# Patient Record
Sex: Male | Born: 1950 | Race: White | Hispanic: No | Marital: Married | Smoking: Former smoker
Health system: Southern US, Community
[De-identification: ages and names within clinical notes are randomized; demographics above are authoritative.]

## PROBLEM LIST (undated history)

## (undated) DIAGNOSIS — I10 Essential (primary) hypertension: Secondary | ICD-10-CM

## (undated) DIAGNOSIS — E119 Type 2 diabetes mellitus without complications: Secondary | ICD-10-CM

## (undated) DIAGNOSIS — K219 Gastro-esophageal reflux disease without esophagitis: Secondary | ICD-10-CM

## (undated) DIAGNOSIS — J45909 Unspecified asthma, uncomplicated: Secondary | ICD-10-CM

## (undated) DIAGNOSIS — J383 Other diseases of vocal cords: Secondary | ICD-10-CM

## (undated) DIAGNOSIS — I4891 Unspecified atrial fibrillation: Secondary | ICD-10-CM

## (undated) DIAGNOSIS — J302 Other seasonal allergic rhinitis: Secondary | ICD-10-CM

## (undated) DIAGNOSIS — G4730 Sleep apnea, unspecified: Secondary | ICD-10-CM

## (undated) DIAGNOSIS — N400 Enlarged prostate without lower urinary tract symptoms: Secondary | ICD-10-CM

## (undated) HISTORY — PX: CARDIAC CATHETERIZATION: SHX172

## (undated) HISTORY — PX: TONSILLECTOMY: SUR1361

## (undated) HISTORY — PX: ADENOIDECTOMY: SUR15

## (undated) HISTORY — PX: CARDIAC SURGERY: SHX584

---

## 2004-06-20 ENCOUNTER — Ambulatory Visit: Payer: Self-pay | Admitting: Emergency Medicine

## 2004-09-05 ENCOUNTER — Ambulatory Visit: Payer: Self-pay | Admitting: Urology

## 2004-09-12 ENCOUNTER — Ambulatory Visit: Payer: Self-pay | Admitting: Urology

## 2005-01-23 ENCOUNTER — Ambulatory Visit: Payer: Self-pay | Admitting: Cardiology

## 2005-01-29 ENCOUNTER — Ambulatory Visit: Payer: Self-pay | Admitting: Family Medicine

## 2005-08-21 ENCOUNTER — Ambulatory Visit: Payer: Self-pay | Admitting: Unknown Physician Specialty

## 2006-04-09 ENCOUNTER — Ambulatory Visit: Payer: Self-pay | Admitting: Unknown Physician Specialty

## 2006-04-19 IMAGING — CT CT ABD-PELV W/ CM
1 of 3 series · 13 of 32 positions shown, 19 images · non-contrast
Comparison: none

REASON FOR EXAM: LT kidney mass  to compare to CT abd pelv WO study on
09-05-04
COMMENTS:

[Series 2: soft tissue · axial · 0.86mm/px · z∈[-684,-264]mm · 13 of 98 slices shown, 19 images]
[im 7/98  soft-tissue]
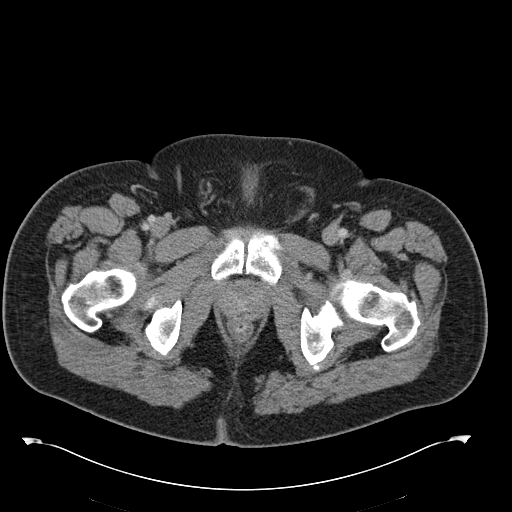
[im 7/98  bone]
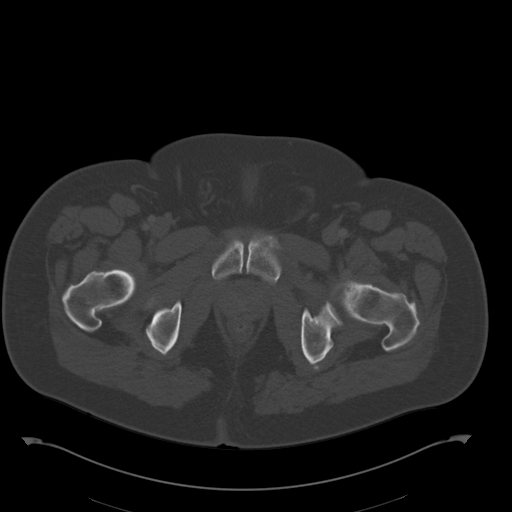
[im 14/98  soft-tissue]
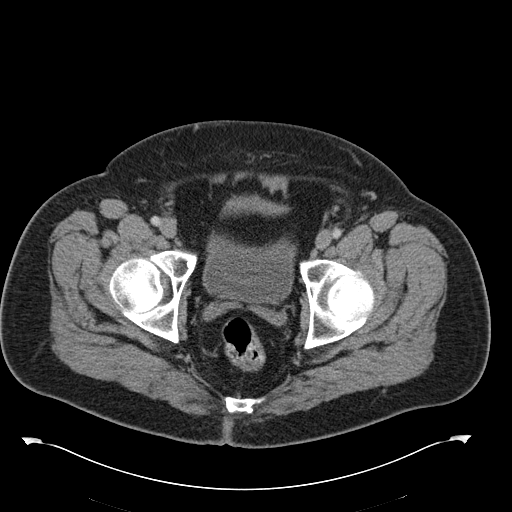
[im 21/98  soft-tissue]
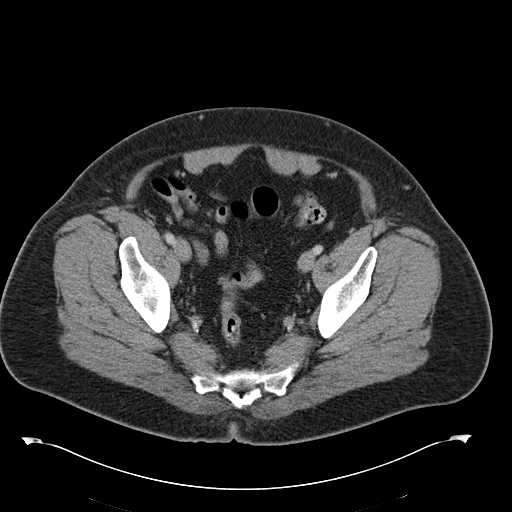
[im 28/98  soft-tissue]
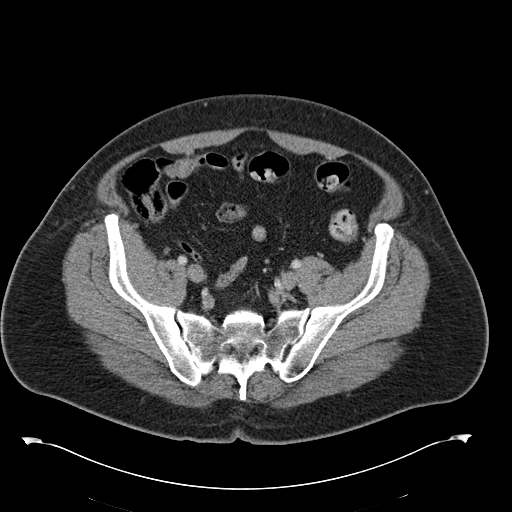
[im 35/98  soft-tissue]
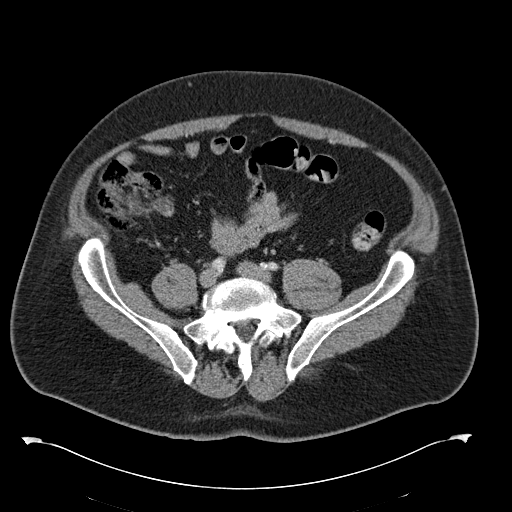
[im 42/98  soft-tissue]
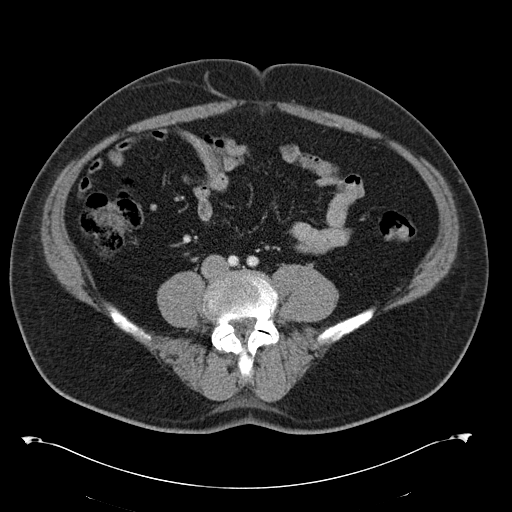
[im 49/98  soft-tissue]
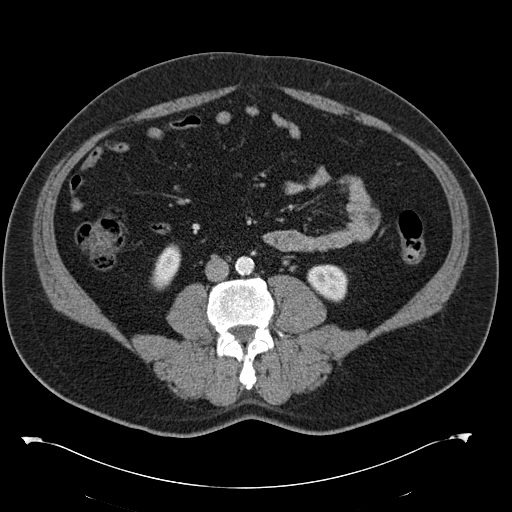
[im 56/98  soft-tissue]
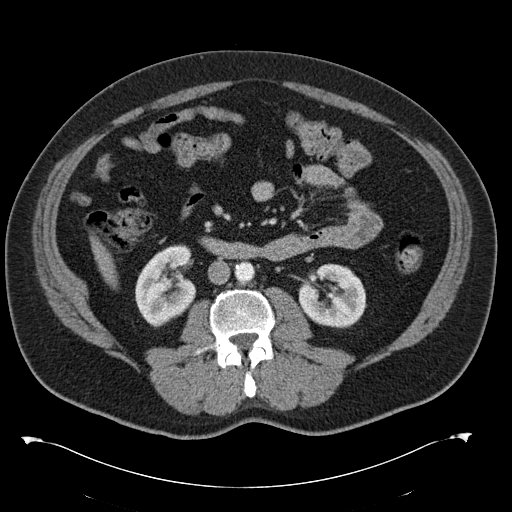
[im 63/98  soft-tissue]
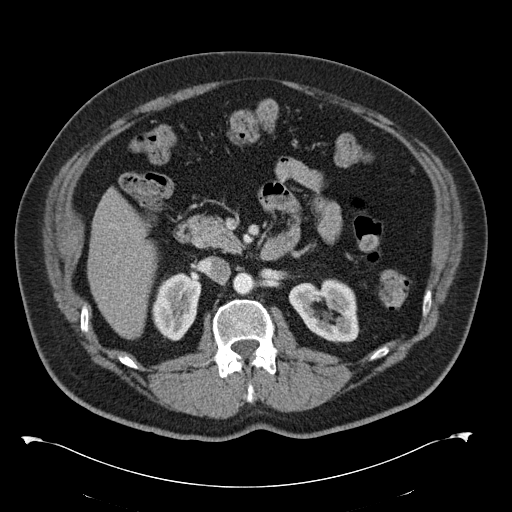
[im 63/98  bone]
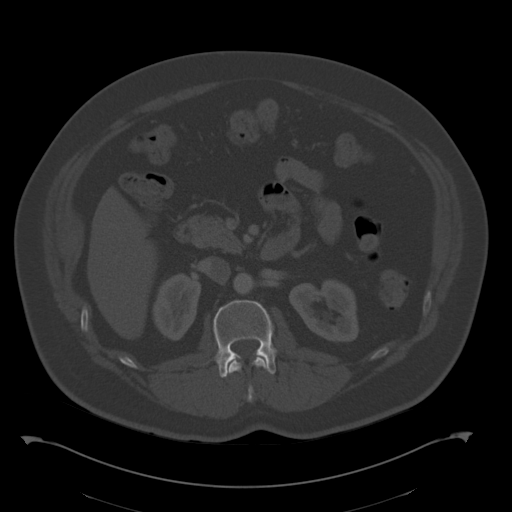
[im 70/98  soft-tissue]
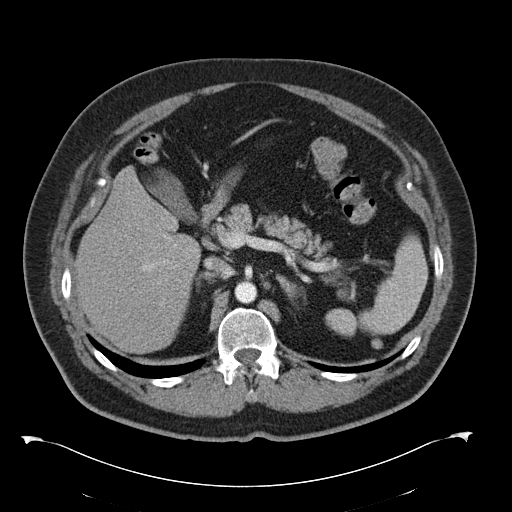
[im 70/98  lung]
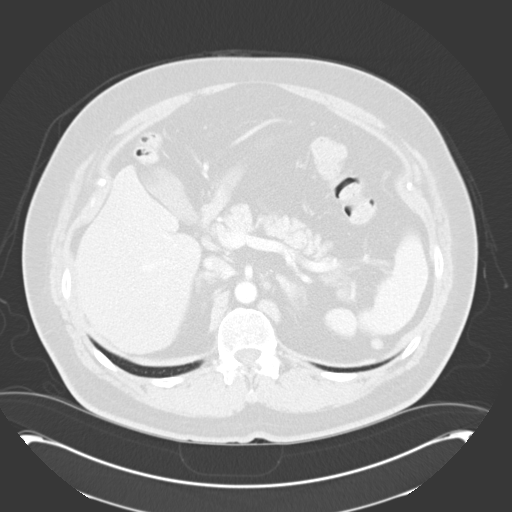
[im 77/98  soft-tissue]
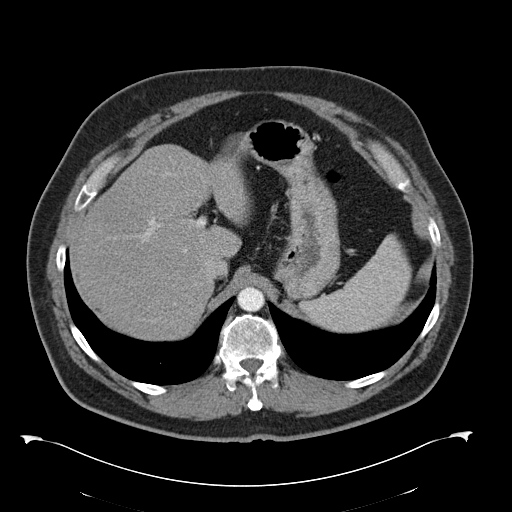
[im 77/98  lung]
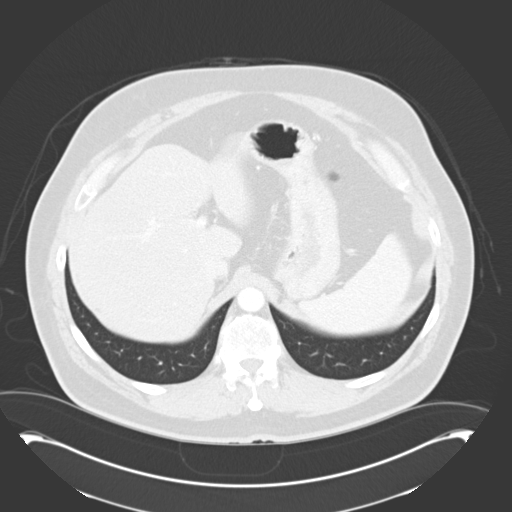
[im 84/98  soft-tissue]
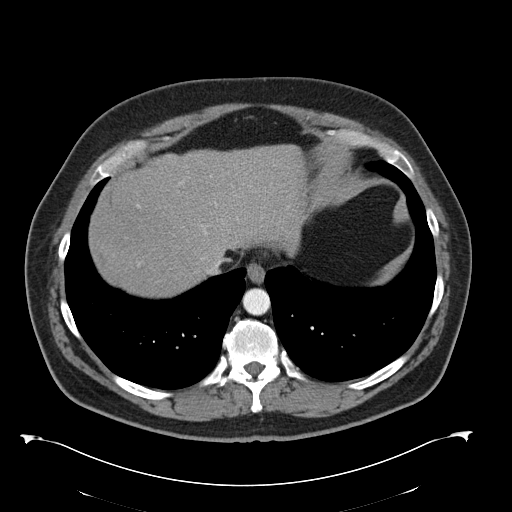
[im 84/98  lung]
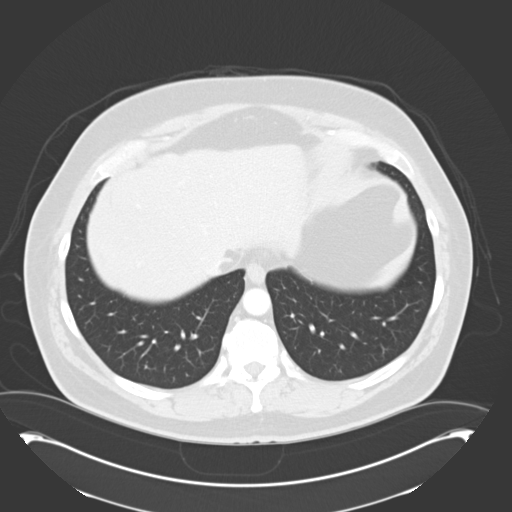
[im 91/98  soft-tissue]
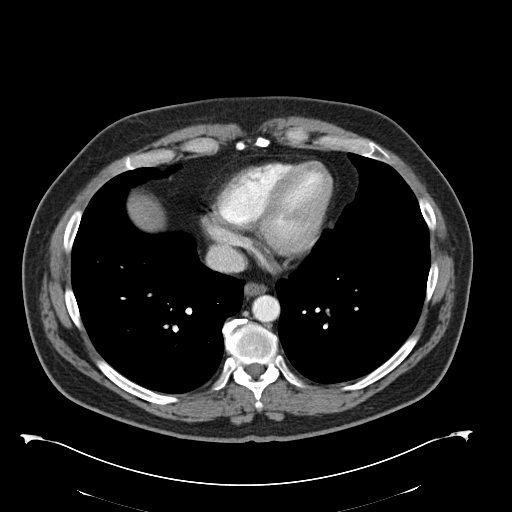
[im 91/98  lung]
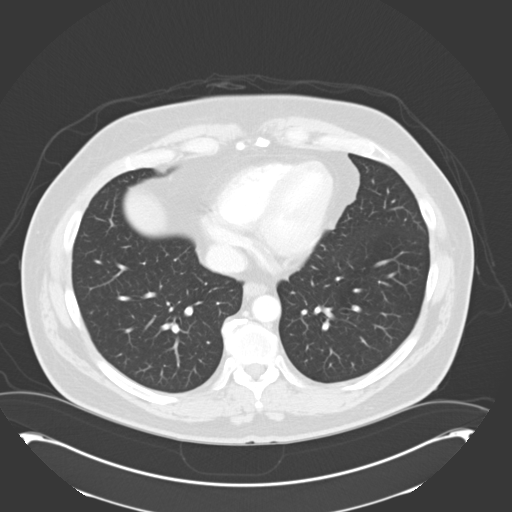

[13 of 32 positions shown; findings below may reference images not displayed]

PROCEDURE:     CT  - CT ABDOMEN / PELVIS  W  - September 12, 2004  [DATE]

RESULT:     IV contrast enhanced CT scan of the abdomen and pelvis is
compared to the prior non-contrast study performed on 09-05-04.  Today's study
shows a fairly well circumscribed area of non-enhancing low-density in the
mid to lower portion of the LEFT kidney anteriorly.  This measures
approximately 2.4 cm in maximal length extending from the parapelvic region
to the cortical surface and suggests a renal cyst, but follow-up to document
stability would be recommended.  There is no hydronephrosis.  The remainder
of the kidney enhances normally. A definite solid lesion is not seen.
Hounsfield readings on this area in the LEFT kidney anteriorly are
approximately 2.0 on the immediate post-contrast images and approximately 4
on the delayed images. The lung bases are clear. No filling defect is seen
in the partially opacified urinary bladder on the delayed images. There is
no evidence of pelvic or retroperitoneal mass or adenopathy.  No radiopaque
gallstones are evident. The upper abdominal viscera appear to be grossly
normal.
IMPRESSION: 1)Low-density area in the LEFT kidney suggestive of a simple LEFT renal
cyst.  No definite associated calcification is seen on this study, but no
non-contrast images were obtained at this time.

## 2006-07-03 ENCOUNTER — Ambulatory Visit: Payer: Self-pay | Admitting: Cardiology

## 2006-09-05 IMAGING — RF DG UGI W/O KUB
1 series · 15 of 18 positions shown · non-contrast
Comparison: none

REASON FOR EXAM: LUQ abdominal pain
COMMENTS:

[Series 1: run · 15 of 18 slices shown]
[im 1/18]
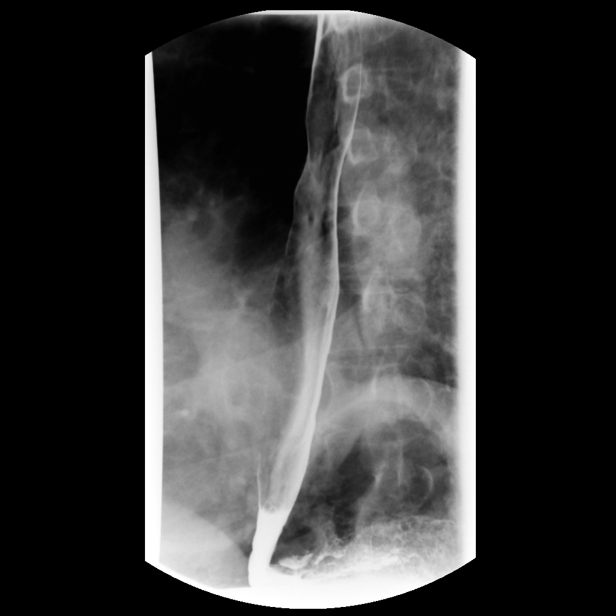
[im 2/18]
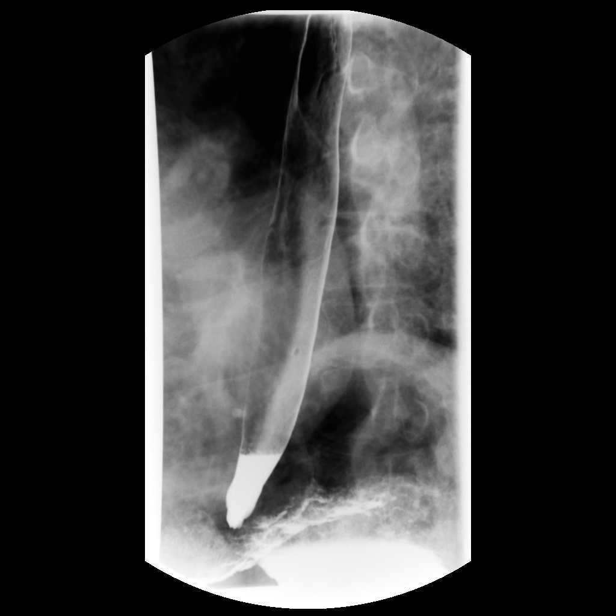
[im 4/18]
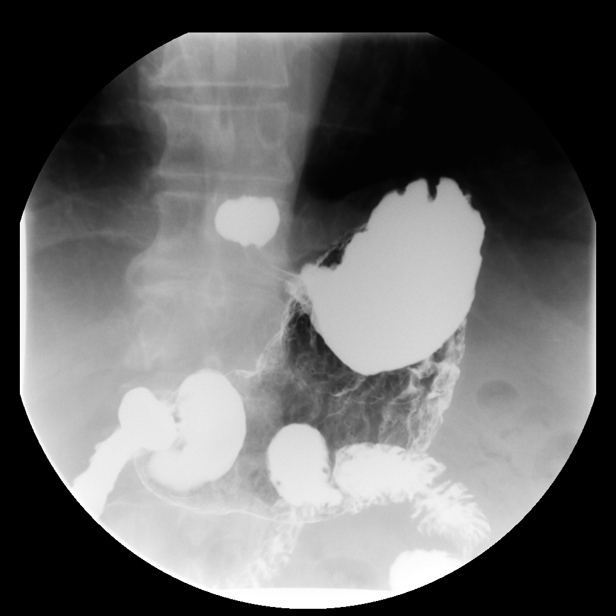
[im 5/18]
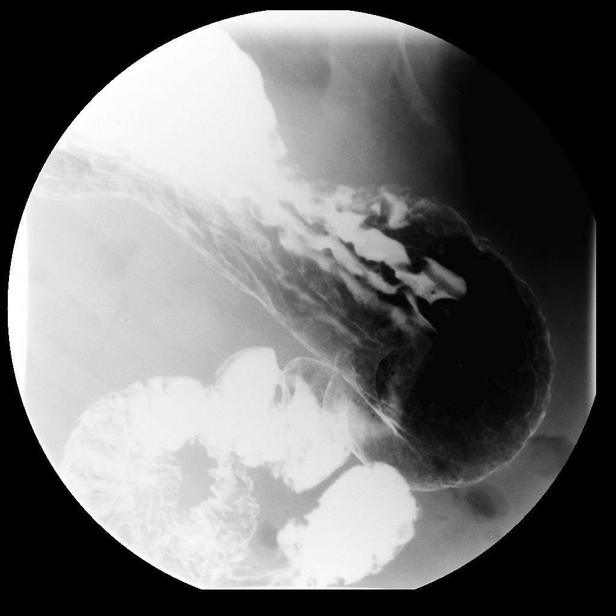
[im 6/18]
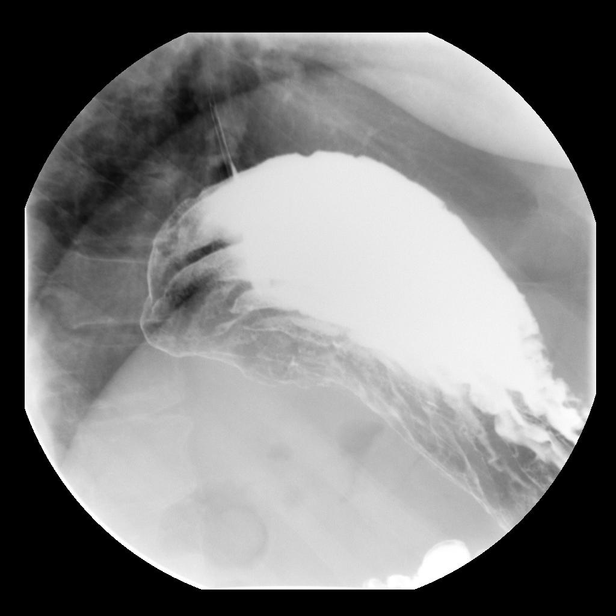
[im 7/18]
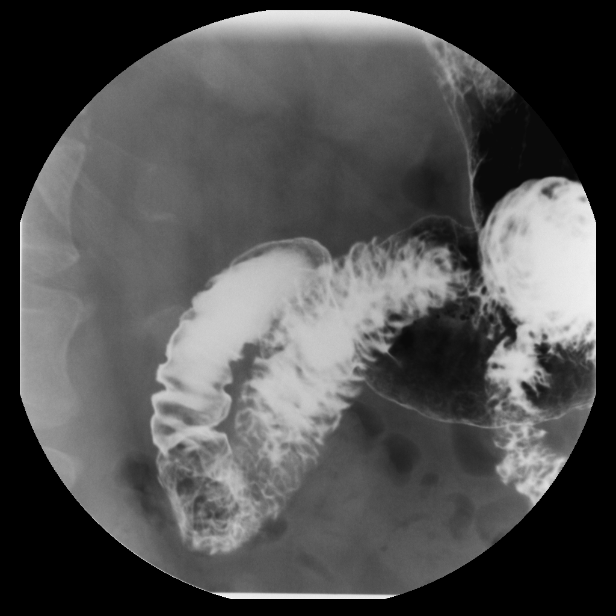
[im 8/18]
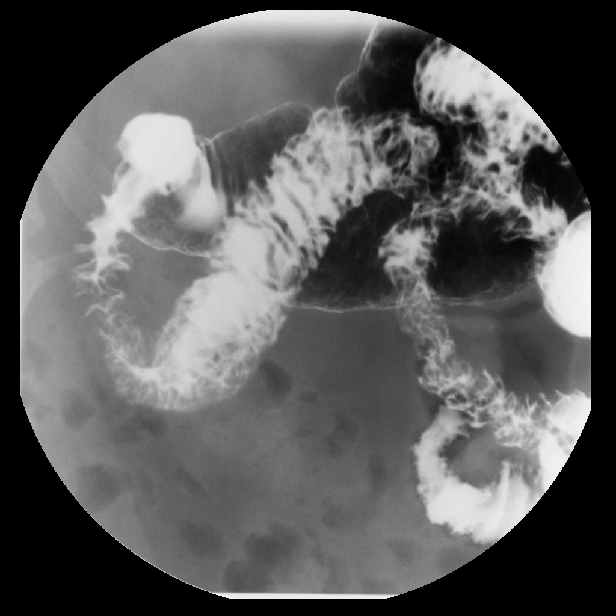
[im 10/18]
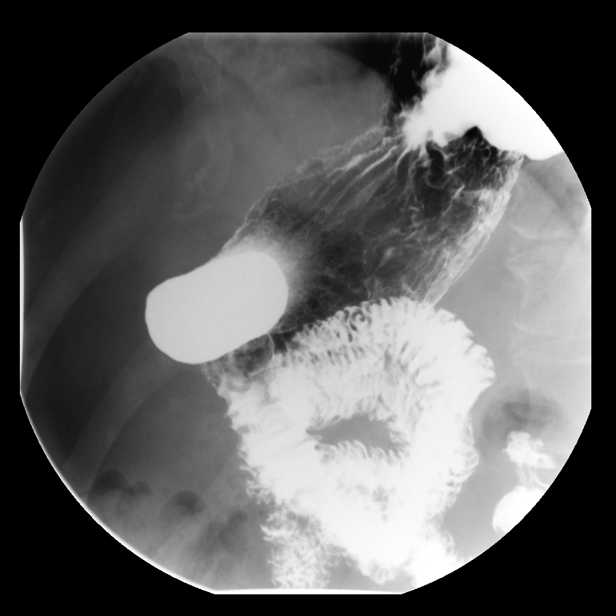
[im 11/18]
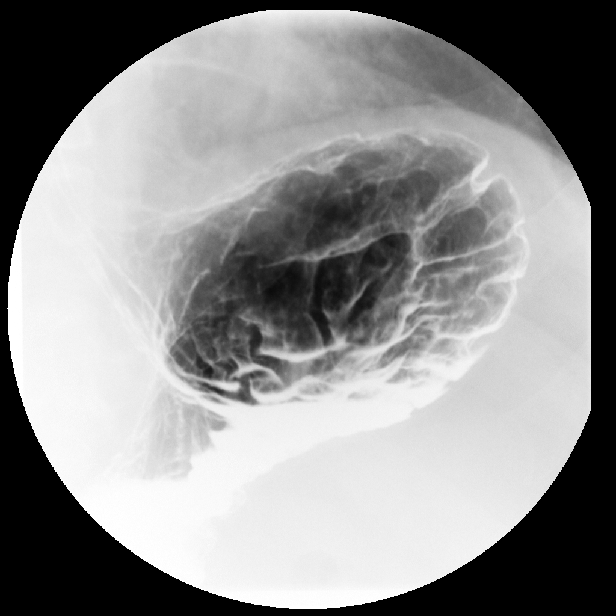
[im 12/18]
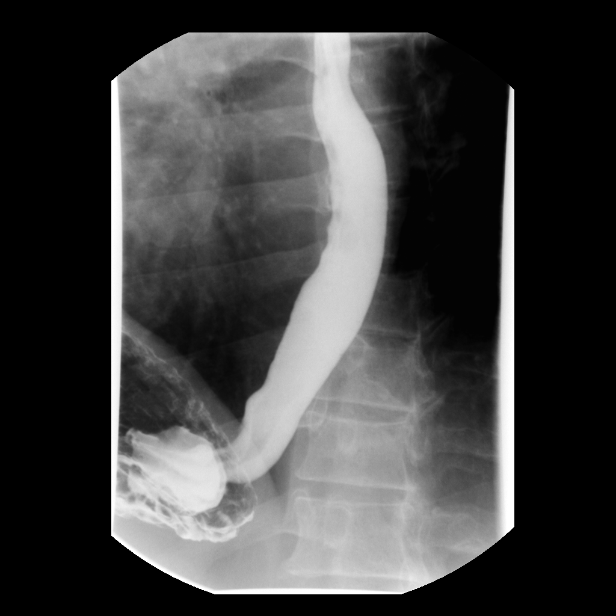
[im 13/18]
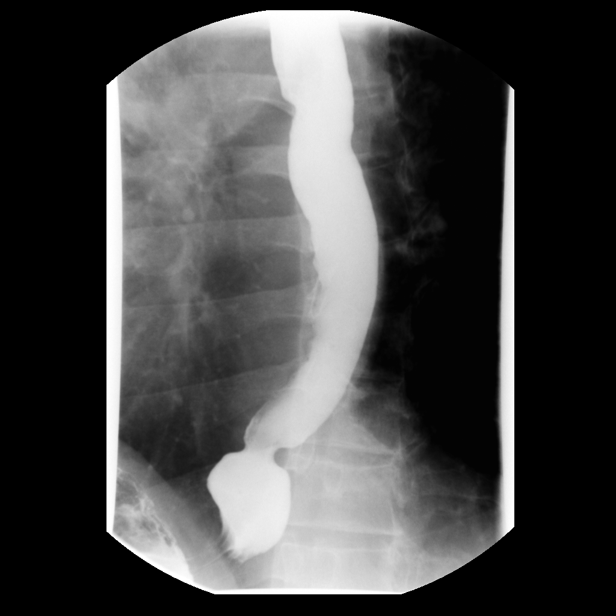
[im 14/18]
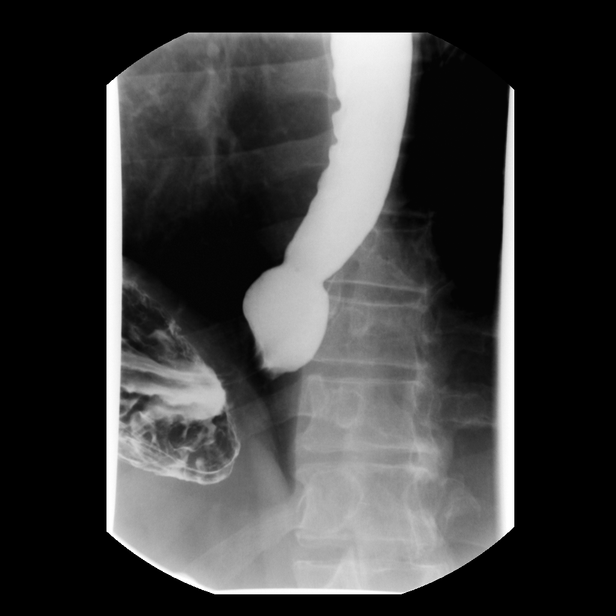
[im 16/18]
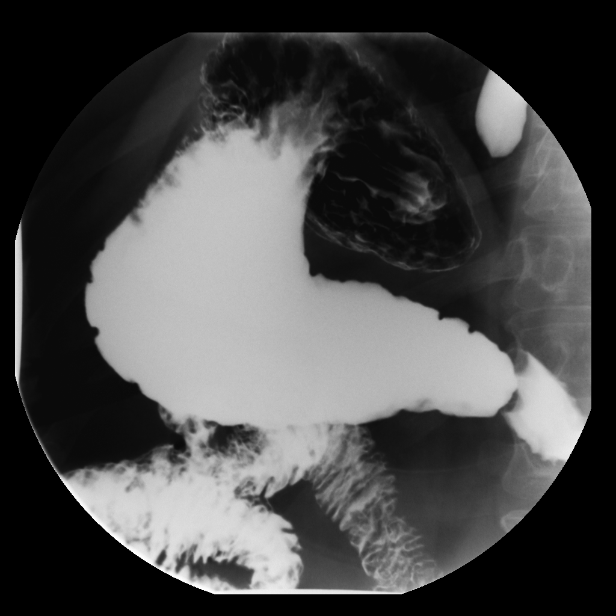
[im 17/18]
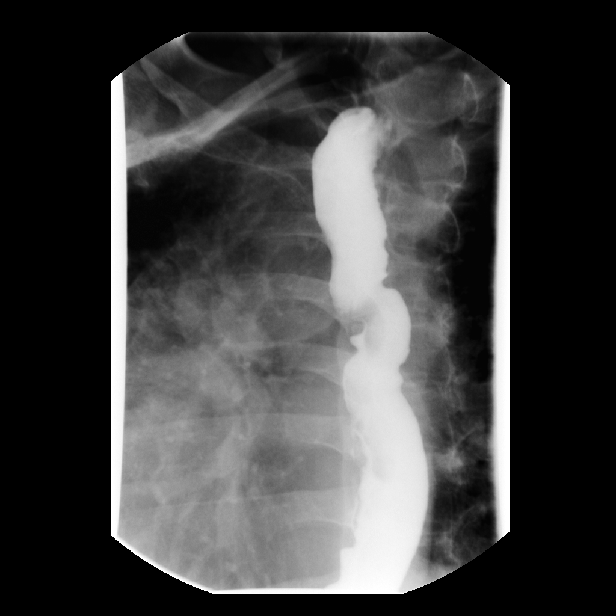
[im 18/18]
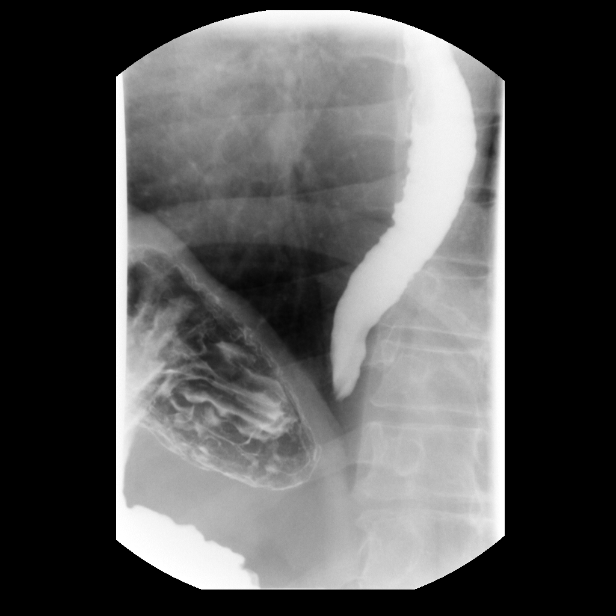

[15 of 18 positions shown; findings below may reference images not displayed]

PROCEDURE:     FL  - FL UPPER GI  - January 29, 2005  [DATE]

RESULT:     The patient is complaining of LEFT upper quadrant discomfort and
has occasional reflux symptoms and heartburn. The patient has known
gallstones. The patient has undergone prior esophageal dilation.

The cervical esophagus appeared normal in a survey fashion. There was no
laryngeal penetration of the barium. The thoracic esophagus distended well.
The 12.0 mm barium pill passed without difficulty. A small, reducible hiatal
hernia was demonstrated. No significant gastroesophageal reflux was
elicited.

The stomach distended well. The duodenal bulb and duodenal C-sweep were
normal in appearance.
IMPRESSION: 1.     There are mild changes of presbyesophagus but I do not see evidence
of fixed stricture or evidence of esophagitis. There is a small, reducible
hiatal hernia.
2.     The stomach and duodenum are normal in appearance.

## 2007-05-28 ENCOUNTER — Ambulatory Visit: Payer: Self-pay | Admitting: Specialist

## 2007-12-03 ENCOUNTER — Ambulatory Visit: Payer: Self-pay | Admitting: Specialist

## 2008-02-07 IMAGING — US US CAROTID DUPLEX BILAT
1 series · 17 of 24 positions shown · non-contrast
Comparison: none

REASON FOR EXAM: Positive Bruit
COMMENTS:

[Series 1: us carotid duplex bilat · 17 of 64 slices shown]
[im 1/64]
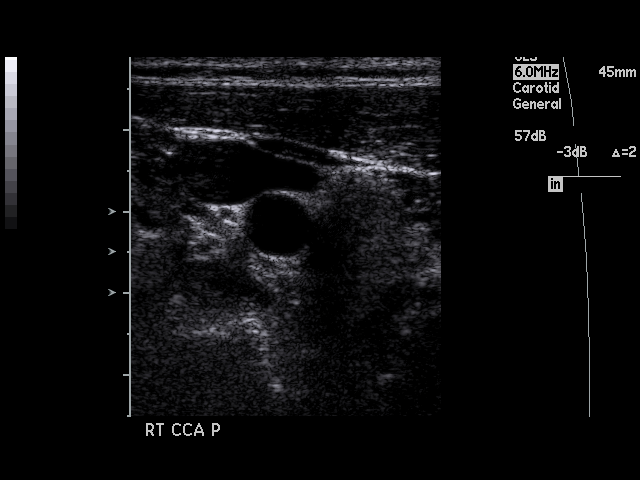
[im 6/64]
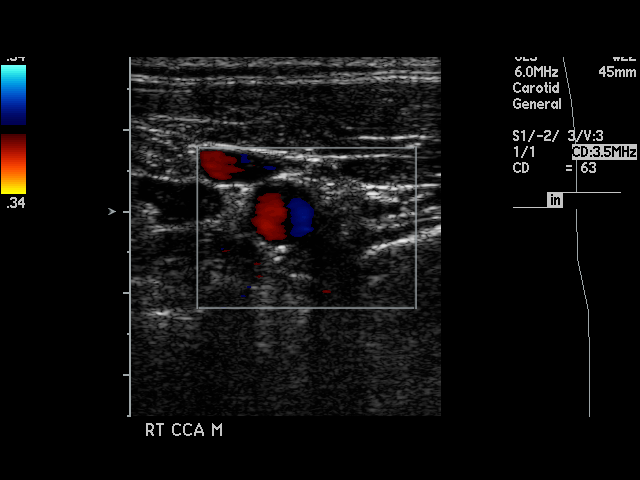
[im 9/64]
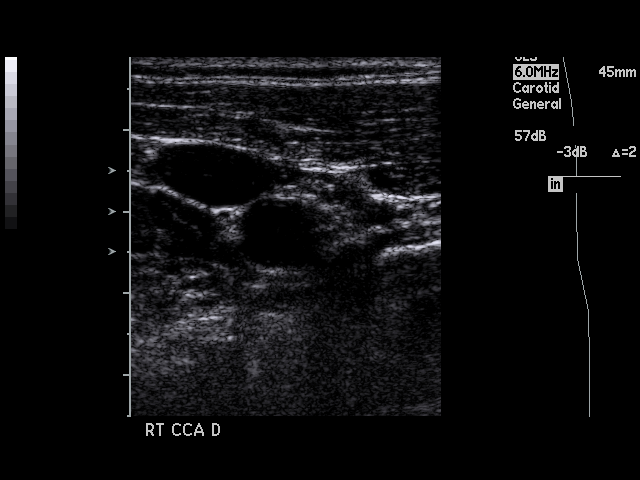
[im 11/64]
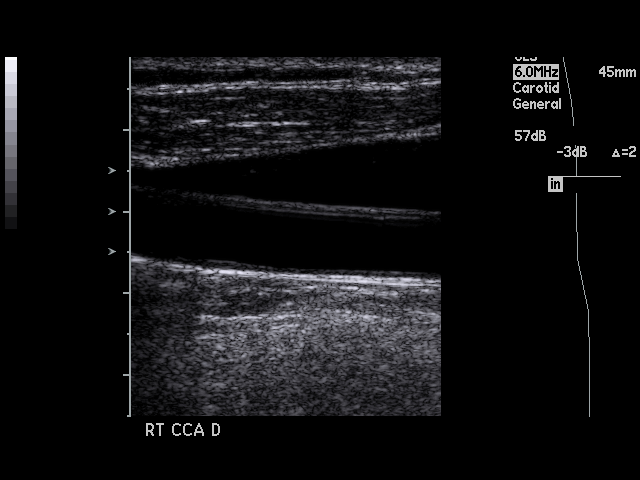
[im 17/64]
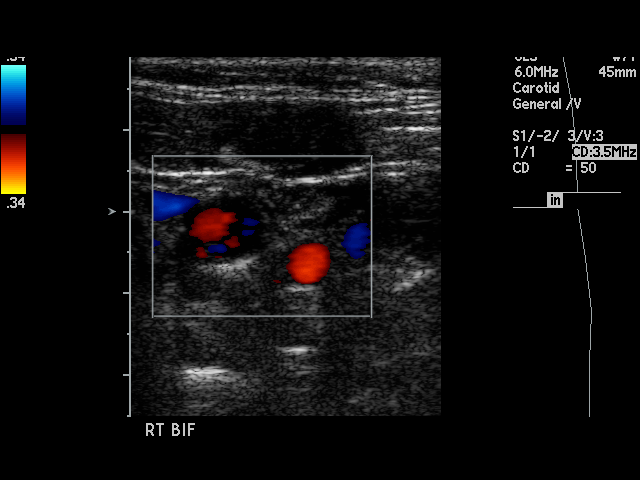
[im 20/64]
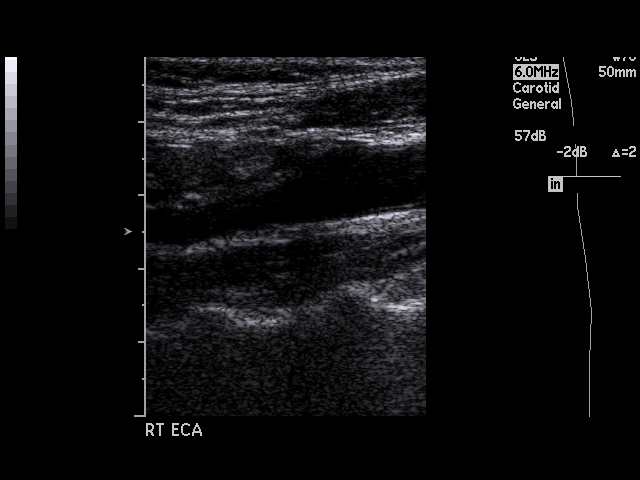
[im 25/64]
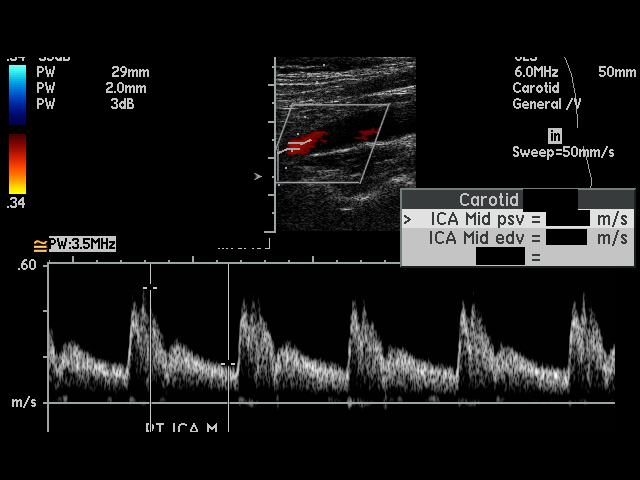
[im 28/64]
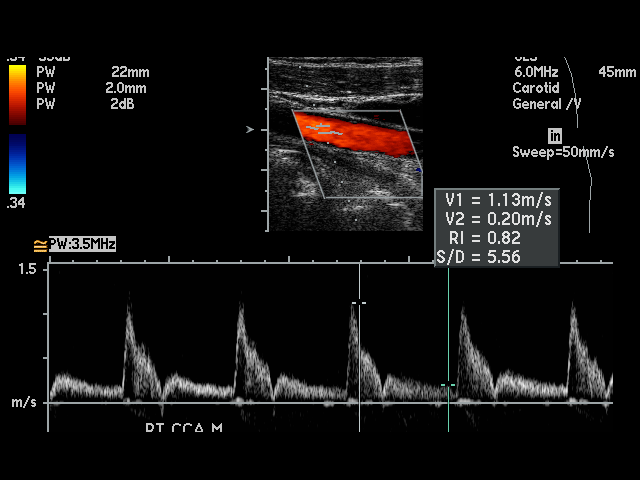
[im 33/64]
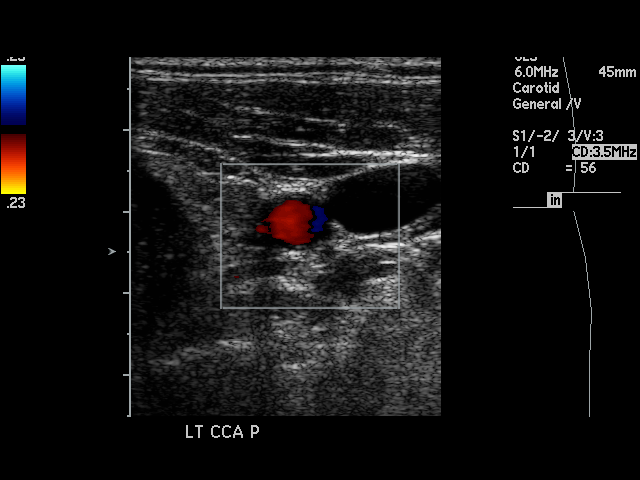
[im 36/64]
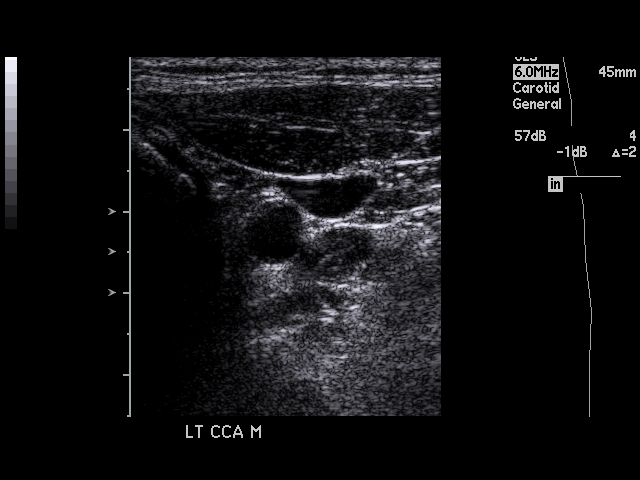
[im 39/64]
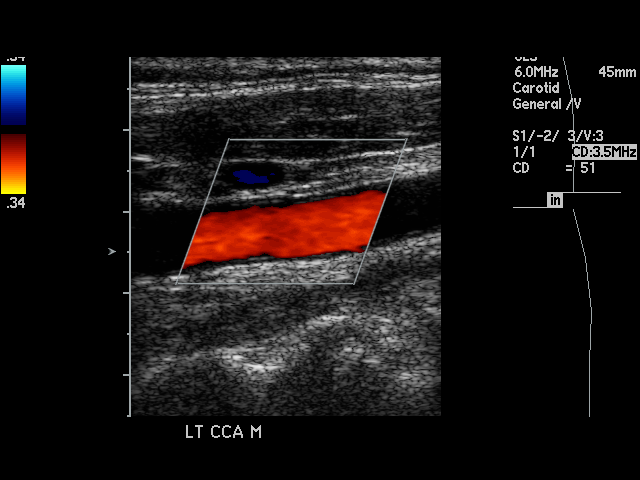
[im 44/64]
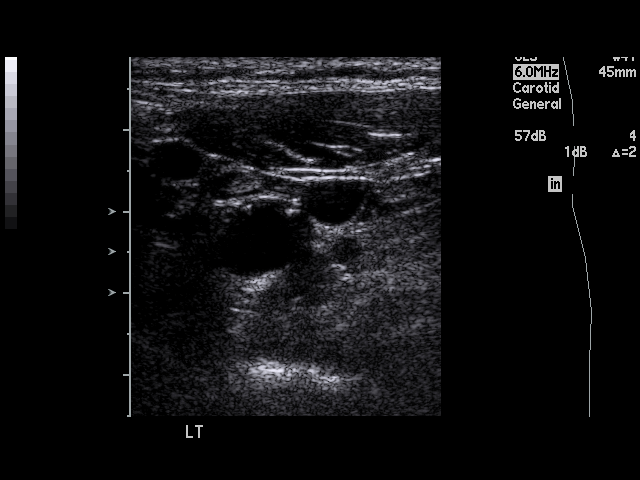
[im 47/64]
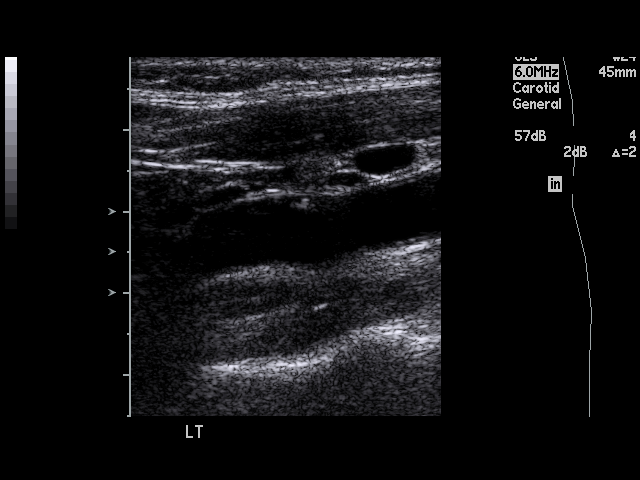
[im 53/64]
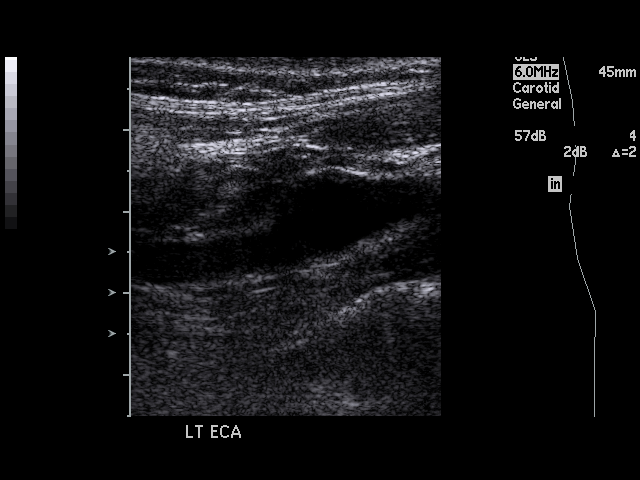
[im 55/64]
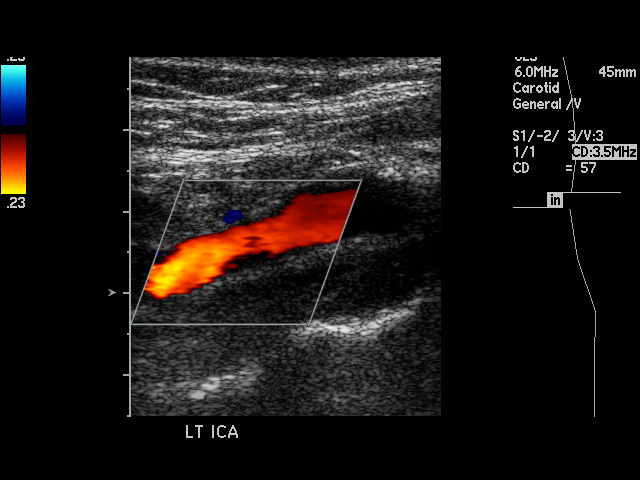
[im 58/64]
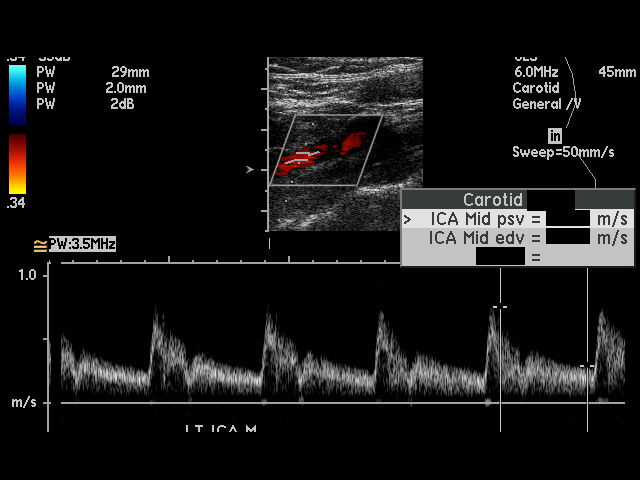
[im 64/64]
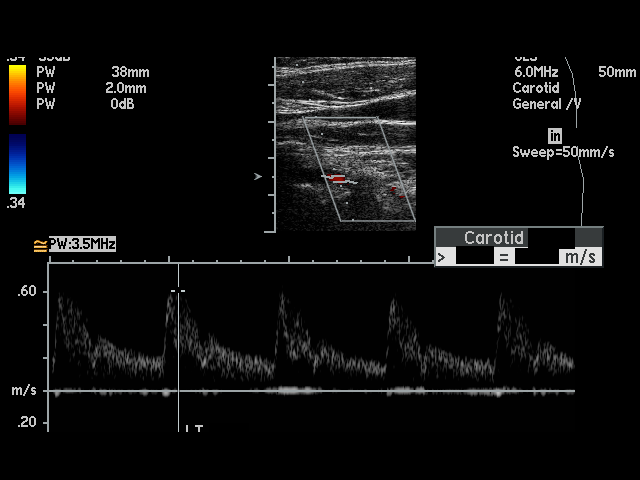

[17 of 24 positions shown; findings below may reference images not displayed]

PROCEDURE:     US  - US CAROTID DOPPLER BILATERAL  - July 03, 2006 [DATE]

RESULT:     Gray scale, Duplex, color and SPECTRAL waveform imaging was
performed of the RIGHT and LEFT carotid systems.

Visual evaluation of the RIGHT carotid system demonstrates asymmetric
calcified plaque within the internal carotid arteries and smooth plaque
within the carotid bulb and intimal thickening in the common carotid artery.
 These areas demonstrate less than 50% visual stenosis.

Evaluation of the LEFT carotid system demonstrates smooth plaque within the
internal carotid artery, calcified plaque in the carotid bulb and intimal
thickening within the common carotid artery without evidence of greater than
50% visual stenosis.

ICA/CCA ratios:  RIGHT:  0.510   LEFT

Gray scale, color flow and SPECTRAL waveform imaging in the RIGHT and LEFT
carotid system demonstrates no abnormalities.

Antegrade flow is demonstrated within the RIGHT and LEFT vertebral arteries.
IMPRESSION: No evidence of hemodynamically significant stenosis within the RIGHT or LEFT
carotid systems as described above.

## 2010-06-01 DIAGNOSIS — L570 Actinic keratosis: Secondary | ICD-10-CM | POA: Insufficient documentation

## 2010-07-06 ENCOUNTER — Ambulatory Visit: Payer: Self-pay | Admitting: General Practice

## 2010-07-13 ENCOUNTER — Ambulatory Visit: Payer: Self-pay | Admitting: General Practice

## 2011-03-12 ENCOUNTER — Ambulatory Visit: Payer: Self-pay | Admitting: Internal Medicine

## 2011-04-17 ENCOUNTER — Ambulatory Visit: Payer: Self-pay | Admitting: Internal Medicine

## 2011-06-11 DIAGNOSIS — R300 Dysuria: Secondary | ICD-10-CM | POA: Insufficient documentation

## 2011-07-01 ENCOUNTER — Ambulatory Visit: Payer: Self-pay | Admitting: Unknown Physician Specialty

## 2011-07-23 DIAGNOSIS — E785 Hyperlipidemia, unspecified: Secondary | ICD-10-CM | POA: Insufficient documentation

## 2011-07-23 DIAGNOSIS — J0100 Acute maxillary sinusitis, unspecified: Secondary | ICD-10-CM | POA: Insufficient documentation

## 2011-07-23 DIAGNOSIS — R05 Cough: Secondary | ICD-10-CM | POA: Insufficient documentation

## 2011-10-03 DIAGNOSIS — E119 Type 2 diabetes mellitus without complications: Secondary | ICD-10-CM | POA: Insufficient documentation

## 2012-02-21 DIAGNOSIS — G4733 Obstructive sleep apnea (adult) (pediatric): Secondary | ICD-10-CM | POA: Insufficient documentation

## 2012-05-07 ENCOUNTER — Ambulatory Visit: Payer: Self-pay | Admitting: Internal Medicine

## 2012-09-14 DIAGNOSIS — J385 Laryngeal spasm: Secondary | ICD-10-CM | POA: Insufficient documentation

## 2012-10-02 ENCOUNTER — Ambulatory Visit: Payer: Self-pay

## 2012-10-27 DIAGNOSIS — Z87898 Personal history of other specified conditions: Secondary | ICD-10-CM | POA: Insufficient documentation

## 2013-09-15 DIAGNOSIS — R7309 Other abnormal glucose: Secondary | ICD-10-CM | POA: Insufficient documentation

## 2013-09-15 DIAGNOSIS — K769 Liver disease, unspecified: Secondary | ICD-10-CM | POA: Insufficient documentation

## 2013-09-15 DIAGNOSIS — I10 Essential (primary) hypertension: Secondary | ICD-10-CM | POA: Insufficient documentation

## 2013-09-15 DIAGNOSIS — E782 Mixed hyperlipidemia: Secondary | ICD-10-CM | POA: Insufficient documentation

## 2014-02-16 DIAGNOSIS — R7303 Prediabetes: Secondary | ICD-10-CM | POA: Insufficient documentation

## 2014-02-16 DIAGNOSIS — I10 Essential (primary) hypertension: Secondary | ICD-10-CM | POA: Insufficient documentation

## 2015-01-10 ENCOUNTER — Telehealth: Payer: Self-pay | Admitting: Family Medicine

## 2015-01-10 ENCOUNTER — Ambulatory Visit: Payer: Self-pay | Admitting: Physician Assistant

## 2015-01-10 DIAGNOSIS — Z299 Encounter for prophylactic measures, unspecified: Secondary | ICD-10-CM

## 2015-01-10 NOTE — Progress Notes (Signed)
Patient ID: Ronnie Cunningham, male   DOB: [birth year], 64 y.o.   MRN: [id number] Patient came in to have a TDap done.  He expressed that he accidentally stuck a wire in his elbow.  He wasn't sure when his last Tetanus was so he just wanted to get the Tdap vaccine.  Pt didn't want to see the Physician Assistant today.

## 2015-01-10 NOTE — Telephone Encounter (Signed)
Notified pt that his last documented tetanus shot was 06/05/2006.

## 2015-01-10 NOTE — Telephone Encounter (Signed)
Pt would like to know when he had his last Tdap shot b/c he was stuck my a piece of metal wire and would like a call asap so he will know if he needs to be treated. It looks like pt was last seen in our office on 02/13/11. Please advised. Thanks TNP

## 2015-02-22 DIAGNOSIS — R7303 Prediabetes: Secondary | ICD-10-CM | POA: Insufficient documentation

## 2015-06-09 ENCOUNTER — Encounter: Payer: Self-pay | Admitting: Sports Medicine

## 2015-06-09 ENCOUNTER — Ambulatory Visit (INDEPENDENT_AMBULATORY_CARE_PROVIDER_SITE_OTHER): Payer: Medicare HMO | Admitting: Sports Medicine

## 2015-06-09 DIAGNOSIS — L600 Ingrowing nail: Secondary | ICD-10-CM

## 2015-06-09 DIAGNOSIS — M79675 Pain in left toe(s): Secondary | ICD-10-CM | POA: Diagnosis not present

## 2015-06-09 DIAGNOSIS — E119 Type 2 diabetes mellitus without complications: Secondary | ICD-10-CM

## 2015-06-09 DIAGNOSIS — B351 Tinea unguium: Secondary | ICD-10-CM | POA: Diagnosis not present

## 2015-06-09 DIAGNOSIS — M79674 Pain in right toe(s): Secondary | ICD-10-CM | POA: Diagnosis not present

## 2015-06-09 MED ORDER — CEPHALEXIN 500 MG PO CAPS: 500.0000 mg | ORAL_CAPSULE | Freq: Three times a day (TID) | ORAL | Status: DC

## 2015-06-09 NOTE — Progress Notes (Signed)
Patient ID: Ronnie Cunningham, male   DOB: [birth year], 65 y.o.   MRN: [id number] Subjective: Ronnie Cunningham is a 65 y.o. Diabetic male patient presents to office today complaining of a painful incurvated, right lateral and left medial nail border with deformity of the hallux nails and thickening as the nails grow out which causes pain. Patient has treated this by self trimming and states that many years ago had nail procedures. Patient denies fever/chills/nausea/vomitting/any other related constitutional symptoms at this time. Patient desires to have his other nails trimmed as well.   FBS 132mg /dl this am  Patient Active Problem List   Diagnosis Date Noted  . Morbid (severe) obesity due to excess calories ([place]) 04/12/2015  . Borderline diabetes mellitus 02/22/2015  . BP (high blood pressure) 02/16/2014  . 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 Abnormal blood sugar 09/15/2013  . Chronic nonalcoholic liver disease 0000000  . Essential (primary) hypertension 09/15/2013  . 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 H/O neoplasm 10/27/2012  . 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 Obstructive apnea 02/21/2012  . Controlled type 2 diabetes mellitus without complication ([place]) [id number]  . Type 2 diabetes mellitus ([place]) 10/03/2011  . Acute antritis 07/23/2011  . Cough 07/23/2011  . HLD (hyperlipidemia) 07/23/2011  . Difficult or painful urination 06/11/2011  . Actinic keratoses 06/01/2010    No current outpatient prescriptions on file prior to visit.   No current facility-administered medications on file prior to visit.    Allergies  Allergen Reactions  . Other Other (See Comments)    Bee sting  . Pollen Extract Other (See Comments)  . Tape Other (See Comments)    Objective:  There were no vitals filed for this visit.  General: Well developed, nourished, in no acute distress, alert and oriented x3   Dermatology: Skin is warm, dry and supple bilateral. Bilateral  hallux nail appears to be  moderately incurvated with hyperkeratosis formation at the distal aspects of  the medial nail border on the left and lateral nail border on the right with significant deformity and thickness of the nails. (-) Erythema. (+) Edema. (-) serosanguous drainage present. The remaining nails appear mildly elongated, discolored, and thickened with no signs of ingrowing. There are no open sores, lesions or other signs of infection present.  Vascular: Dorsalis Pedis artery and Posterior Tibial artery pedal pulses are 2/4 bilateral with immedate capillary fill time. Mild Pedal hair growth present.  Neruologic: Grossly intact via light touch bilateral. Protective and vibratory intact bilateral.   Musculoskeletal: Tenderness to palpation of the right an left hallux nail fold(s). Muscular strength within normal limits in all groups bilateral.   Assesement and Plan: Problem List Items Addressed This Visit    None    Visit Diagnoses    Ingrown nail    -  Primary    Relevant Medications    cephALEXin (KEFLEX) 500 MG capsule    Dermatophytosis of nail        Relevant Medications    sulfamethoxazole-trimethoprim (BACTRIM DS,SEPTRA DS) 800-160 MG tablet    cephALEXin (KEFLEX) 500 MG capsule    Toe pain, bilateral        Diabetes mellitus without complication (HCC)        Relevant Medications    aspirin 325 MG tablet    atorvastatin (LIPITOR) 20 MG tablet    bisoprolol-hydrochlorothiazide (ZIAC) 2.5-6.25 MG tablet    metFORMIN (GLUCOPHAGE) 500 MG tablet       -Discussed treatment alternatives and plan  of care; Explained permanent/temporary nail avulsion and post procedure course to patient. Patient opt for permanent avulsion of both the left and right hallux nails - After a verbal consent, injected 3 ml of a 50:50 mixture of 2% plain  lidocaine and 0.5% plain marcaine in a normal hallux block fashion to the left and right hallux. Next, a  betadine prep was performed.  Anesthesia was tested and found to be appropriate.  The left and right hallux nails were then incised from the hyponychium to the epinychium. The nails were removed and cleared from the field. The areas were curretted for any remaining nail or spicules. Phenol application performed and the areas were then flushed with alcohol and dressed with antibiotic cream and a dry sterile dressing. -Patient was instructed to leave the dressings intact for today and begin soaking  in a weak solution of betadine and water tomorrow. Patient was instructed to  soak for 15 minutes each day and apply neosporin and a gauze or bandaid dressing each day. -Patient was instructed to monitor the tose for signs of infection and return to office if toe becomes red, hot or swollen. -Rx Keflex 500mg  for prophylaxsis against infection in the setting of bilateral nail procedures and diabetes -Mechanically debrided all other nails using a sterile nail nipper without incident -Patient is to return in 1-2 weeks for follow up care/nail check or sooner if problems arise.  Landis Martins, DPM

## 2015-06-09 NOTE — Patient Instructions (Signed)

## 2015-06-20 ENCOUNTER — Ambulatory Visit (INDEPENDENT_AMBULATORY_CARE_PROVIDER_SITE_OTHER): Payer: Medicare HMO | Admitting: Sports Medicine

## 2015-06-20 ENCOUNTER — Encounter: Payer: Self-pay | Admitting: Sports Medicine

## 2015-06-20 DIAGNOSIS — B351 Tinea unguium: Secondary | ICD-10-CM

## 2015-06-20 DIAGNOSIS — M79675 Pain in left toe(s): Secondary | ICD-10-CM

## 2015-06-20 DIAGNOSIS — L600 Ingrowing nail: Secondary | ICD-10-CM

## 2015-06-20 DIAGNOSIS — M79674 Pain in right toe(s): Secondary | ICD-10-CM

## 2015-06-20 DIAGNOSIS — E119 Type 2 diabetes mellitus without complications: Secondary | ICD-10-CM

## 2015-06-20 NOTE — Progress Notes (Signed)
Patient ID: Ronnie Cunningham, male   DOB: 03-31-1950, 65 y.o.   MRN: [id number] Subjective: Ronnie Cunningham is a 65 y.o. diabetic male patient returns to office today for follow up evaluation after having Right and Left Hallux total permanent nail avulsions performed on 06-09-15. Patient has been soaking using betadine and applying topical antibiotic covered with bandaid daily. Patient deniesfever/chills/nausea/vomitting/any other related constitutional symptoms at this time.  FBS 130  Patient Active Problem List   Diagnosis Date Noted  . Morbid (severe) obesity due to excess calories (HCC) 04/12/2015  . Borderline diabetes mellitus 02/22/2015  . BP (high blood pressure) 02/16/2014  . Chemical diabetes (HCC) 02/16/2014  . Abnormal blood sugar 09/15/2013  . Chronic nonalcoholic liver disease 09/15/2013  . Essential (primary) hypertension 09/15/2013  . 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 H/O neoplasm 10/27/2012  . 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 Obstructive apnea 02/21/2012  . Controlled type 2 diabetes mellitus without complication (HCC) 10/03/2011  . Type 2 diabetes mellitus (HCC) 10/03/2011  . Acute antritis 07/23/2011  . Cough 07/23/2011  . HLD (hyperlipidemia) 07/23/2011  . Difficult or painful urination 06/11/2011  . Actinic keratoses 06/01/2010    Current Outpatient Prescriptions on File Prior to Visit  Medication Sig Dispense Refill  . albuterol (PROAIR HFA) 108 (90 Base) MCG/ACT inhaler Inhale into the lungs.    . ALPRAZolam (XANAX) 0.25 MG tablet Take 0.25 mg by mouth.    Marland Kitchen amLODipine (NORVASC) 10 MG tablet Take 10 mg by mouth.    Marland Kitchen amLODipine (NORVASC) 5 MG tablet Take 5 mg by mouth.    Marland Kitchen aspirin 325 MG tablet Take by mouth.    Marland Kitchen atorvastatin (LIPITOR) 20 MG tablet Take 20 mg by mouth.    . bisoprolol-hydrochlorothiazide (ZIAC) 2.5-6.25 MG tablet Take by mouth.    . Blood Glucose Monitoring Suppl (FIFTY50 GLUCOSE METER 2.0) w/Device KIT     .  buPROPion (WELLBUTRIN SR) 150 MG 12 hr tablet Take 150 mg by mouth.    . cephALEXin (KEFLEX) 500 MG capsule Take 1 capsule (500 mg total) by mouth 3 (three) times daily. 21 capsule 0  . ciprofloxacin (CIPRO) 250 MG tablet Take 250 mg by mouth.    . Coenzyme Q10 (CO Q 10) 10 MG CAPS Take by mouth.    . dicyclomine (BENTYL) 10 MG capsule Take by mouth.    . escitalopram (LEXAPRO) 10 MG tablet Take 10 mg by mouth.    . escitalopram (LEXAPRO) 20 MG tablet Take 20 mg by mouth.    . finasteride (PROSCAR) 5 MG tablet Take by mouth.    . fluticasone (FLONASE) 50 MCG/ACT nasal spray Place into the nose.    Marland Kitchen glucose blood (ONE TOUCH ULTRA TEST) test strip     . hydrocortisone 2.5 % ointment     . loratadine-pseudoephedrine (CLARITIN-D 24-HOUR) 10-240 MG 24 hr tablet Take by mouth.    . loratadine-pseudoephedrine (LORATADINE-D 12HR) 5-120 MG tablet Take by mouth.    . metFORMIN (GLUCOPHAGE) 500 MG tablet Take 500 mg by mouth.    . montelukast (SINGULAIR) 10 MG tablet Take by mouth.    . montelukast (SINGULAIR) 5 MG chewable tablet Chew by mouth.    . Multiple Vitamin (MULTIVITAMIN) capsule Take by mouth.    Marland Kitchen NIFEdipine (PROCARDIA XL) 30 MG 24 hr tablet Take 30 mg by mouth.    . pantoprazole (PROTONIX) 20 MG tablet Take by mouth.    . pantoprazole (PROTONIX) 40 MG tablet Take  by mouth.    . tamsulosin (FLOMAX) 0.4 MG CAPS capsule     . zolpidem (AMBIEN CR) 6.25 MG CR tablet Take 6.25 mg by mouth.    . zolpidem (AMBIEN) 10 MG tablet Take by mouth.     No current facility-administered medications on file prior to visit.    Allergies  Allergen Reactions  . Other Other (See Comments)    Bee sting  . Pollen Extract Other (See Comments)  . Tape Other (See Comments)    Objective:  General: Well developed, nourished, in no acute distress, alert and oriented x3   Dermatology: Skin is warm, dry and supple bilateral. Bilateral hallux nail beds appear to be clean, dry, with mild granular tissue and  surrounding eschar/scab. (-) Erythema. (-) Edema. (-) serosanguous drainage present. The remaining nails appear unremarkable at this time. There are no other lesions or other signs of infection  present.  Neurovascular status: Intact. No lower extremity swelling; No pain with calf compression bilateral.  Musculoskeletal: Decreased tenderness to palpation of the Bilateral hallux nail beds. Muscular strength within normal limits bilateral.   Assesement and Plan: Problem List Items Addressed This Visit    None    Visit Diagnoses    Ingrown nail    -  Primary    s/p total permanent bilateral hallux nail avulsions, 06-09-15    Dermatophytosis of nail        Toe pain, bilateral        Diabetes mellitus without complication ([place])           -Examined patient  -Cleansed right and left hallux nail beds and gently scrubbed with peroxide and applied antibiotic cream covered with bandaid.  -Discussed plan of care with patient. -Patient to now begin soaking in a weak solution of Epsom salt and warm water. Patient was instructed to soak for 15-20 minutes each day until the toes appears normal and there is no drainage, redness, tenderness, or swelling at the procedure site, and apply neosporin and a gauze or bandaid dressing each day as needed. May leave open to air at night. -Patient to finish Keflex -Educated patient on long term care after nail surgery. -Patient was instructed to monitor the toe for reoccurrence and signs of infection; Patient advised to return to office or go to ER if toes becomes red, hot or swollen. -Patient is to return in 2 months for routine Diabetic foot care or sooner if problems arise.  Landis Martins, DPM

## 2015-08-25 ENCOUNTER — Ambulatory Visit (INDEPENDENT_AMBULATORY_CARE_PROVIDER_SITE_OTHER): Payer: Medicare HMO | Admitting: Sports Medicine

## 2015-08-25 ENCOUNTER — Encounter: Payer: Self-pay | Admitting: Sports Medicine

## 2015-08-25 DIAGNOSIS — B351 Tinea unguium: Secondary | ICD-10-CM

## 2015-08-25 DIAGNOSIS — M79674 Pain in right toe(s): Secondary | ICD-10-CM | POA: Diagnosis not present

## 2015-08-25 DIAGNOSIS — M79675 Pain in left toe(s): Secondary | ICD-10-CM

## 2015-08-25 DIAGNOSIS — Z9889 Other specified postprocedural states: Secondary | ICD-10-CM

## 2015-08-25 DIAGNOSIS — E119 Type 2 diabetes mellitus without complications: Secondary | ICD-10-CM

## 2015-08-25 NOTE — Progress Notes (Signed)
Patient ID: Ronnie Cunningham, male   DOB: August 27, 1950, 65 y.o.   MRN: [id number] Subjective: Ronnie Cunningham is a 65 y.o. male patient with history of diabetes who presents to office today complaining of long, painful nails  while ambulating in shoes; unable to trim. Therefore follow up evaluation after bilateral hallux nail avulsions which were performed for 09/21/2015; denies any acute symptoms hallux nail procedure sites. Admits to occasional dry skin.  Patient states that the glucose reading this morning was not recorded, but ranges from 70-150, A1c 6.2. Admits that he has been suffering with allergy and sinus problems this summer. Patient denies any new changes in medication or new problems. Patient denies any new cramping, numbness, burning or tingling in the legs.  Patient Active Problem List   Diagnosis Date Noted  . Morbid (severe) obesity due to excess calories ([place]) 04/12/2015  . Morbid obesity ([place]) 04/12/2015  . Borderline diabetes mellitus 02/22/2015  . BP (high blood pressure) 02/16/2014  . 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 Abnormal blood sugar 09/15/2013  . Chronic nonalcoholic liver disease 78/29/5621  . Essential (primary) hypertension 09/15/2013  . 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 H/O neoplasm 10/27/2012  . 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 Obstructive apnea 02/21/2012  . Controlled type 2 diabetes mellitus without complication ([place]) 30/86/5784  . Type 2 diabetes mellitus ([place]) 10/03/2011  . Acute antritis 07/23/2011  . Cough 07/23/2011  . HLD (hyperlipidemia) 07/23/2011  . Difficult or painful urination 06/11/2011  . Actinic keratoses 06/01/2010   Current Outpatient Prescriptions on File Prior to Visit  Medication Sig Dispense Refill  . albuterol (PROAIR HFA) 108 (90 Base) MCG/ACT inhaler Inhale into the lungs.    . ALPRAZolam (XANAX) 0.25 MG tablet Take 0.25 mg by mouth.    Marland Kitchen amLODipine (NORVASC) 10 MG tablet Take 10 mg by mouth.    Marland Kitchen  amLODipine (NORVASC) 5 MG tablet Take 5 mg by mouth.    Marland Kitchen aspirin 325 MG tablet Take by mouth.    Marland Kitchen atorvastatin (LIPITOR) 20 MG tablet Take 20 mg by mouth.    . bisoprolol-hydrochlorothiazide (ZIAC) 2.5-6.25 MG tablet Take by mouth.    . Blood Glucose Monitoring Suppl (FIFTY50 GLUCOSE METER 2.0) w/Device KIT     . buPROPion (WELLBUTRIN SR) 150 MG 12 hr tablet Take 150 mg by mouth.    . cephALEXin (KEFLEX) 500 MG capsule Take 1 capsule (500 mg total) by mouth 3 (three) times daily. 21 capsule 0  . ciprofloxacin (CIPRO) 250 MG tablet Take 250 mg by mouth.    . Coenzyme Q10 (CO Q 10) 10 MG CAPS Take by mouth.    . dicyclomine (BENTYL) 10 MG capsule Take by mouth.    . escitalopram (LEXAPRO) 10 MG tablet Take 10 mg by mouth.    . escitalopram (LEXAPRO) 20 MG tablet Take 20 mg by mouth.    . finasteride (PROSCAR) 5 MG tablet Take by mouth.    . fluticasone (FLONASE) 50 MCG/ACT nasal spray Place into the nose.    Marland Kitchen glucose blood (ONE TOUCH ULTRA TEST) test strip     . hydrocortisone 2.5 % ointment     . loratadine-pseudoephedrine (CLARITIN-D 24-HOUR) 10-240 MG 24 hr tablet Take by mouth.    . loratadine-pseudoephedrine (LORATADINE-D 12HR) 5-120 MG tablet Take by mouth.    . metFORMIN (GLUCOPHAGE) 500 MG tablet Take 500 mg by mouth.    . montelukast (SINGULAIR) 10 MG tablet Take by mouth.    Marland Kitchen  montelukast (SINGULAIR) 5 MG chewable tablet Chew by mouth.    . Multiple Vitamin (MULTIVITAMIN) capsule Take by mouth.    Marland Kitchen NIFEdipine (PROCARDIA XL) 30 MG 24 hr tablet Take 30 mg by mouth.    . pantoprazole (PROTONIX) 20 MG tablet Take by mouth.    . pantoprazole (PROTONIX) 40 MG tablet Take by mouth.    . tamsulosin (FLOMAX) 0.4 MG CAPS capsule     . zolpidem (AMBIEN CR) 6.25 MG CR tablet Take 6.25 mg by mouth.    . zolpidem (AMBIEN) 10 MG tablet Take by mouth.     No current facility-administered medications on file prior to visit.   Allergies  Allergen Reactions  . Bee Pollen Other (See  Comments)  . Other Other (See Comments)    Bee sting Bee sting  . Pollen Extract Other (See Comments)  . Tape Other (See Comments)    No results found for this or any previous visit (from the past 2160 hour(s)).  Objective: General: Patient is awake, alert, and oriented x 3 and in no acute distress.  Integument: Skin is warm, dry and supple bilateral. Nails are tender, long, thickened and  dystrophic with subungual debris, consistent with onychomycosis, 2-5 bilateral. Bilateral hallux nailbed clean, dry, with no acute signs of infection. No open lesions or preulcerative lesions present bilateral. Remaining integument unremarkable.  Vasculature:  Dorsalis Pedis pulse 2/4 bilateral. Posterior Tibial pulse  2/4 bilateral.  Capillary fill time <3 sec 1-5 bilateral. Positive hair growth to the level of the digits. Temperature gradient within normal limits. No varicosities present bilateral. No edema present bilateral.   Neurology: The patient has intact sensation measured with a 5.07/10g Semmes Weinstein Monofilament at all pedal sites bilateral . Vibratory sensation intact bilateral with tuning fork. No Babinski sign present bilateral.   Musculoskeletal: No symptomatic pedal deformities noted bilateral. Muscular strength 5/5 in all lower extremity muscular groups bilateral without pain on range of motion . No tenderness with calf compression bilateral.  Assessment and Plan: Problem List Items Addressed This Visit    None    Visit Diagnoses    S/P nail surgery    -  Primary    Bilateral hallux total permanent nail avulsions performed on 06/09/2015    Dermatophytosis of nail        Toe pain, bilateral        Diabetes mellitus without complication ([place])          -Examined patient. -Discussed and educated patient on diabetic foot care, especially with  regards to the vascular, neurological and musculoskeletal systems.  -Stressed the importance of good glycemic control and the detriment  of not  controlling glucose levels in relation to the foot. -Mechanically debrided all nails 2-5 bilateral using sterile nail nipper and filed with dremel without incident  -Advised patient for dry skin around nail fold at previous hallux avulsion sites. May use to urea cream or tea tree oil -Answered all patient questions -Patient to return  in 3 months for at risk foot care -Patient advised to call the office if any problems or questions arise in the meantime.  Landis Martins, DPM

## 2015-12-15 ENCOUNTER — Ambulatory Visit (INDEPENDENT_AMBULATORY_CARE_PROVIDER_SITE_OTHER): Payer: Medicare HMO | Admitting: Podiatry

## 2015-12-15 ENCOUNTER — Encounter: Payer: Self-pay | Admitting: Podiatry

## 2015-12-15 DIAGNOSIS — B351 Tinea unguium: Secondary | ICD-10-CM | POA: Diagnosis not present

## 2015-12-15 DIAGNOSIS — E0843 Diabetes mellitus due to underlying condition with diabetic autonomic (poly)neuropathy: Secondary | ICD-10-CM

## 2015-12-15 DIAGNOSIS — L608 Other nail disorders: Secondary | ICD-10-CM

## 2015-12-15 DIAGNOSIS — M79676 Pain in unspecified toe(s): Secondary | ICD-10-CM | POA: Diagnosis not present

## 2015-12-15 DIAGNOSIS — L603 Nail dystrophy: Secondary | ICD-10-CM

## 2015-12-15 DIAGNOSIS — M79609 Pain in unspecified limb: Principal | ICD-10-CM

## 2015-12-17 NOTE — Progress Notes (Signed)
SUBJECTIVE Patient with a history of diabetes mellitus presents to office today complaining of elongated, thickened nails. Pain while ambulating in shoes. Patient is unable to trim their own nails.  Patient states that he has had a total nail avulsion to the right great toe however he is concerned that there is a regrowth of that portion of the nail.  Allergies  Allergen Reactions  . Bee Pollen Other (See Comments)  . Other Other (See Comments)    Bee sting Bee sting  . Pollen Extract Other (See Comments)  . Tape Other (See Comments)    OBJECTIVE General Patient is awake, alert, and oriented x 3 and in no acute distress. Derm Skin is dry and supple bilateral. Negative open lesions or macerations. Remaining integument unremarkable. Nails are tender, long, thickened and dystrophic with subungual debris, consistent with onychomycosis, 1-5 bilateral. No signs of infection noted. Vasc  DP and PT pedal pulses palpable bilaterally. Temperature gradient within normal limits.  Neuro Epicritic and protective threshold sensation diminished bilaterally.  Musculoskeletal Exam No symptomatic pedal deformities noted bilateral. Muscular strength within normal limits.  ASSESSMENT 1. Diabetes Mellitus w/ peripheral neuropathy 2. Onychomycosis of nail due to dermatophyte bilateral 3. Pain in foot bilateral  PLAN OF CARE 1. Patient evaluated today. 2. Instructed to maintain good pedal hygiene and foot care. Stressed importance of controlling blood sugar.  3. Mechanical debridement of nails 1-5 bilaterally performed using a nail nipper. Filed with dremel without incident.  4. Return to clinic in 3 mos.    Edrick Kins, DPM

## 2016-10-26 ENCOUNTER — Inpatient Hospital Stay
Admission: EM | Admit: 2016-10-26 | Discharge: 2016-10-27 | DRG: 690 | Disposition: A | Discharge status: Home / Self Care | Payer: Medicare HMO | Attending: Internal Medicine | Admitting: Internal Medicine

## 2016-10-26 ENCOUNTER — Encounter: Payer: Self-pay | Admitting: *Deleted

## 2016-10-26 ENCOUNTER — Inpatient Hospital Stay: Payer: Medicare HMO

## 2016-10-26 ENCOUNTER — Ambulatory Visit (INDEPENDENT_AMBULATORY_CARE_PROVIDER_SITE_OTHER)
Admission: EM | Admit: 2016-10-26 | Discharge: 2016-10-26 | Disposition: A | Discharge status: Other Acute Inpatient Hospital | Payer: Medicare HMO | Source: Home / Self Care | Attending: Family Medicine | Admitting: Family Medicine

## 2016-10-26 ENCOUNTER — Encounter: Payer: Self-pay | Admitting: Emergency Medicine

## 2016-10-26 ENCOUNTER — Emergency Department: Payer: Medicare HMO

## 2016-10-26 DIAGNOSIS — Z87891 Personal history of nicotine dependence: Secondary | ICD-10-CM

## 2016-10-26 DIAGNOSIS — I10 Essential (primary) hypertension: Secondary | ICD-10-CM | POA: Diagnosis present

## 2016-10-26 DIAGNOSIS — J45909 Unspecified asthma, uncomplicated: Secondary | ICD-10-CM | POA: Diagnosis present

## 2016-10-26 DIAGNOSIS — Z7984 Long term (current) use of oral hypoglycemic drugs: Secondary | ICD-10-CM

## 2016-10-26 DIAGNOSIS — Z6841 Body Mass Index (BMI) 40.0 and over, adult: Secondary | ICD-10-CM | POA: Diagnosis not present

## 2016-10-26 DIAGNOSIS — N400 Enlarged prostate without lower urinary tract symptoms: Secondary | ICD-10-CM | POA: Diagnosis present

## 2016-10-26 DIAGNOSIS — R319 Hematuria, unspecified: Secondary | ICD-10-CM

## 2016-10-26 DIAGNOSIS — G4730 Sleep apnea, unspecified: Secondary | ICD-10-CM | POA: Diagnosis present

## 2016-10-26 DIAGNOSIS — I480 Paroxysmal atrial fibrillation: Secondary | ICD-10-CM | POA: Diagnosis present

## 2016-10-26 DIAGNOSIS — Z7982 Long term (current) use of aspirin: Secondary | ICD-10-CM | POA: Diagnosis not present

## 2016-10-26 DIAGNOSIS — R109 Unspecified abdominal pain: Secondary | ICD-10-CM | POA: Diagnosis not present

## 2016-10-26 DIAGNOSIS — E119 Type 2 diabetes mellitus without complications: Secondary | ICD-10-CM | POA: Diagnosis present

## 2016-10-26 DIAGNOSIS — I4891 Unspecified atrial fibrillation: Secondary | ICD-10-CM

## 2016-10-26 DIAGNOSIS — N390 Urinary tract infection, site not specified: Principal | ICD-10-CM | POA: Diagnosis present

## 2016-10-26 DIAGNOSIS — Z79899 Other long term (current) drug therapy: Secondary | ICD-10-CM

## 2016-10-26 HISTORY — DX: Benign prostatic hyperplasia without lower urinary tract symptoms: N40.0

## 2016-10-26 HISTORY — DX: Unspecified asthma, uncomplicated: J45.909

## 2016-10-26 HISTORY — DX: Morbid (severe) obesity due to excess calories: E66.01

## 2016-10-26 HISTORY — DX: Type 2 diabetes mellitus without complications: E11.9

## 2016-10-26 HISTORY — DX: Other diseases of vocal cords: J38.3

## 2016-10-26 HISTORY — DX: Other seasonal allergic rhinitis: J30.2

## 2016-10-26 HISTORY — DX: Sleep apnea, unspecified: G47.30

## 2016-10-26 HISTORY — DX: Essential (primary) hypertension: I10

## 2016-10-26 LAB — COMPREHENSIVE METABOLIC PANEL
ALT: 22 U/L (ref 17–63)
ANION GAP: 8 (ref 5–15)
AST: 19 U/L (ref 15–41)
Albumin: 3.7 g/dL (ref 3.5–5.0)
Alkaline Phosphatase: 56 U/L (ref 38–126)
BILIRUBIN TOTAL: 0.5 mg/dL (ref 0.3–1.2)
BUN: 14 mg/dL (ref 6–20)
CHLORIDE: 106 mmol/L (ref 101–111)
CO2: 28 mmol/L (ref 22–32)
Calcium: 9.4 mg/dL (ref 8.9–10.3)
Creatinine, Ser: 0.98 mg/dL (ref 0.61–1.24)
Glucose, Bld: 160 mg/dL — ABNORMAL HIGH (ref 65–99)
POTASSIUM: 3.6 mmol/L (ref 3.5–5.1)
Sodium: 142 mmol/L (ref 135–145)
TOTAL PROTEIN: 6.7 g/dL (ref 6.5–8.1)

## 2016-10-26 LAB — CBC WITH DIFFERENTIAL/PLATELET
BASOS ABS: 0.1 10*3/uL (ref 0–0.1)
Basophils Relative: 1 %
Eosinophils Absolute: 0 10*3/uL (ref 0–0.7)
Eosinophils Relative: 0 %
HEMATOCRIT: 43.2 % (ref 40.0–52.0)
Hemoglobin: 14.6 g/dL (ref 13.0–18.0)
LYMPHS PCT: 15 %
Lymphs Abs: 2.9 10*3/uL (ref 1.0–3.6)
MCH: 29.9 pg (ref 26.0–34.0)
MCHC: 33.8 g/dL (ref 32.0–36.0)
MCV: 88.4 fL (ref 80.0–100.0)
MONO ABS: 1.7 10*3/uL — AB (ref 0.2–1.0)
Monocytes Relative: 9 %
Neutro Abs: 15 10*3/uL — ABNORMAL HIGH (ref 1.4–6.5)
Neutrophils Relative %: 75 %
PLATELETS: 154 10*3/uL (ref 150–440)
RBC: 4.88 MIL/uL (ref 4.40–5.90)
RDW: 13.3 % (ref 11.5–14.5)
WBC: 19.7 10*3/uL — ABNORMAL HIGH (ref 3.8–10.6)

## 2016-10-26 LAB — URINALYSIS, COMPLETE (UACMP) WITH MICROSCOPIC
Bilirubin Urine: NEGATIVE
Bilirubin Urine: NEGATIVE
GLUCOSE, UA: NEGATIVE mg/dL
Glucose, UA: NEGATIVE mg/dL
KETONES UR: NEGATIVE mg/dL
Ketones, ur: NEGATIVE mg/dL
NITRITE: NEGATIVE
Nitrite: POSITIVE — AB
PH: 6.5 (ref 5.0–8.0)
PH: 6 (ref 5.0–8.0)
Protein, ur: 30 mg/dL — AB
Protein, ur: NEGATIVE mg/dL
SPECIFIC GRAVITY, URINE: 1.01 (ref 1.005–1.030)
SPECIFIC GRAVITY, URINE: 1.005 (ref 1.005–1.030)
SQUAMOUS EPITHELIAL / LPF: NONE SEEN
Squamous Epithelial / LPF: NONE SEEN

## 2016-10-26 LAB — GLUCOSE, CAPILLARY
Glucose-Capillary: 130 mg/dL — ABNORMAL HIGH (ref 65–99)
Glucose-Capillary: 115 mg/dL — ABNORMAL HIGH (ref 65–99)

## 2016-10-26 LAB — TSH: TSH: 3.157 u[IU]/mL (ref 0.350–4.500)

## 2016-10-26 LAB — TROPONIN I

## 2016-10-26 MED ORDER — FINASTERIDE 5 MG PO TABS
5.0000 mg | ORAL_TABLET | Freq: Every day | ORAL | Status: DC
  Administered 2016-10-26 – 2016-10-27 (×2): 5 mg via ORAL
  Filled 2016-10-26 (×2): qty 1

## 2016-10-26 MED ORDER — ALPRAZOLAM 0.25 MG PO TABS: 0.2500 mg | ORAL_TABLET | Freq: Every day | ORAL | Status: DC | PRN

## 2016-10-26 MED ORDER — FLUTICASONE PROPIONATE 50 MCG/ACT NA SUSP
2.0000 | Freq: Every day | NASAL | Status: DC
  Administered 2016-10-27: 2 via NASAL
  Filled 2016-10-26: qty 16

## 2016-10-26 MED ORDER — ENOXAPARIN SODIUM 40 MG/0.4ML ~~LOC~~ SOLN
40.0000 mg | Freq: Two times a day (BID) | SUBCUTANEOUS | Status: DC
  Administered 2016-10-26 – 2016-10-27 (×2): 40 mg via SUBCUTANEOUS
  Filled 2016-10-26 (×2): qty 0.4

## 2016-10-26 MED ORDER — SODIUM CHLORIDE 0.9 % IV BOLUS (SEPSIS)
500.0000 mL | Freq: Once | INTRAVENOUS | Status: AC
  Administered 2016-10-26: 500 mL via INTRAVENOUS

## 2016-10-26 MED ORDER — ASPIRIN 325 MG PO TABS
325.0000 mg | ORAL_TABLET | Freq: Every day | ORAL | Status: DC
  Administered 2016-10-27: 325 mg via ORAL
  Filled 2016-10-26: qty 1

## 2016-10-26 MED ORDER — PSEUDOEPHEDRINE HCL 60 MG PO TABS
120.0000 mg | ORAL_TABLET | Freq: Two times a day (BID) | ORAL | Status: DC
  Administered 2016-10-27: 120 mg via ORAL
  Filled 2016-10-26: qty 2

## 2016-10-26 MED ORDER — ADULT MULTIVITAMIN W/MINERALS CH
1.0000 | ORAL_TABLET | Freq: Every day | ORAL | Status: DC
  Administered 2016-10-27: 1 via ORAL
  Filled 2016-10-26: qty 1

## 2016-10-26 MED ORDER — TAMSULOSIN HCL 0.4 MG PO CAPS
0.4000 mg | ORAL_CAPSULE | Freq: Every day | ORAL | Status: DC
  Administered 2016-10-26: 0.4 mg via ORAL
  Filled 2016-10-26: qty 1

## 2016-10-26 MED ORDER — SODIUM CHLORIDE 0.9 % IV SOLN
INTRAVENOUS | Status: DC
  Administered 2016-10-26 – 2016-10-27 (×2): via INTRAVENOUS

## 2016-10-26 MED ORDER — LORATADINE 10 MG PO TABS
10.0000 mg | ORAL_TABLET | Freq: Every day | ORAL | Status: DC
  Administered 2016-10-27: 10 mg via ORAL
  Filled 2016-10-26: qty 1

## 2016-10-26 MED ORDER — PANTOPRAZOLE SODIUM 40 MG PO TBEC
40.0000 mg | DELAYED_RELEASE_TABLET | Freq: Every day | ORAL | Status: DC
Start: 2016-10-27 — End: 2016-10-27
  Administered 2016-10-27: 40 mg via ORAL
  Filled 2016-10-26: qty 1

## 2016-10-26 MED ORDER — ACETAMINOPHEN 325 MG PO TABS
650.0000 mg | ORAL_TABLET | Freq: Four times a day (QID) | ORAL | Status: DC | PRN
  Administered 2016-10-26 – 2016-10-27 (×2): 650 mg via ORAL
  Filled 2016-10-26 (×2): qty 2

## 2016-10-26 MED ORDER — METOPROLOL TARTRATE 5 MG/5ML IV SOLN
5.0000 mg | Freq: Once | INTRAVENOUS | Status: AC
  Administered 2016-10-26: 5 mg via INTRAVENOUS
  Filled 2016-10-26: qty 5

## 2016-10-26 MED ORDER — ALBUTEROL SULFATE (2.5 MG/3ML) 0.083% IN NEBU: 3.0000 mL | INHALATION_SOLUTION | Freq: Four times a day (QID) | RESPIRATORY_TRACT | Status: DC | PRN

## 2016-10-26 MED ORDER — ONDANSETRON HCL 4 MG PO TABS: 4.0000 mg | ORAL_TABLET | Freq: Four times a day (QID) | ORAL | Status: DC | PRN

## 2016-10-26 MED ORDER — HYDROCODONE-ACETAMINOPHEN 5-325 MG PO TABS: 1.0000 | ORAL_TABLET | ORAL | Status: DC | PRN

## 2016-10-26 MED ORDER — TRAZODONE HCL 50 MG PO TABS
50.0000 mg | ORAL_TABLET | Freq: Every day | ORAL | Status: DC
  Administered 2016-10-26: 50 mg via ORAL
  Filled 2016-10-26: qty 1

## 2016-10-26 MED ORDER — ASPIRIN EC 325 MG PO TBEC
325.0000 mg | DELAYED_RELEASE_TABLET | Freq: Every day | ORAL | Status: DC
  Administered 2016-10-26: 325 mg via ORAL
  Filled 2016-10-26: qty 1

## 2016-10-26 MED ORDER — LORATADINE-PSEUDOEPHEDRINE ER 10-240 MG PO TB24: 1.0000 | ORAL_TABLET | Freq: Every day | ORAL | Status: DC

## 2016-10-26 MED ORDER — MONTELUKAST SODIUM 10 MG PO TABS
10.0000 mg | ORAL_TABLET | Freq: Every day | ORAL | Status: DC
  Administered 2016-10-26: 10 mg via ORAL
  Filled 2016-10-26: qty 1

## 2016-10-26 MED ORDER — ONDANSETRON HCL 4 MG/2ML IJ SOLN: 4.0000 mg | Freq: Four times a day (QID) | INTRAMUSCULAR | Status: DC | PRN

## 2016-10-26 MED ORDER — ESCITALOPRAM OXALATE 20 MG PO TABS
20.0000 mg | ORAL_TABLET | Freq: Every day | ORAL | Status: DC
  Administered 2016-10-26 – 2016-10-27 (×2): 20 mg via ORAL
  Filled 2016-10-26 (×3): qty 1

## 2016-10-26 MED ORDER — AMLODIPINE BESYLATE 10 MG PO TABS
10.0000 mg | ORAL_TABLET | Freq: Every day | ORAL | Status: DC
  Administered 2016-10-26 – 2016-10-27 (×2): 10 mg via ORAL
  Filled 2016-10-26 (×2): qty 1

## 2016-10-26 MED ORDER — DEXTROSE 5 % IV SOLN
1.0000 g | Freq: Once | INTRAVENOUS | Status: AC
  Administered 2016-10-26: 1 g via INTRAVENOUS
  Filled 2016-10-26: qty 10

## 2016-10-26 MED ORDER — INSULIN ASPART 100 UNIT/ML ~~LOC~~ SOLN
0.0000 [IU] | Freq: Three times a day (TID) | SUBCUTANEOUS | Status: DC
  Administered 2016-10-26 – 2016-10-27 (×2): 1 [IU] via SUBCUTANEOUS
  Filled 2016-10-26 (×2): qty 1

## 2016-10-26 MED ORDER — ACETAMINOPHEN 650 MG RE SUPP: 650.0000 mg | Freq: Four times a day (QID) | RECTAL | Status: DC | PRN

## 2016-10-26 MED ORDER — DEXTROSE 5 % IV SOLN
1.0000 g | INTRAVENOUS | Status: DC
  Administered 2016-10-27: 1 g via INTRAVENOUS
  Filled 2016-10-26: qty 10

## 2016-10-26 MED ORDER — METFORMIN HCL 500 MG PO TABS
500.0000 mg | ORAL_TABLET | Freq: Two times a day (BID) | ORAL | Status: DC
  Administered 2016-10-26 – 2016-10-27 (×2): 500 mg via ORAL
  Filled 2016-10-26 (×2): qty 1

## 2016-10-26 NOTE — ED Triage Notes (Signed)
Patient started having symptoms of fever, body aches, left ear pain, and dark cloudy urine 4 days ago.

## 2016-10-26 NOTE — H&P (Addendum)
[facility] [place] NAME: Ronnie Cunningham    MR#:  [id number]  DATE OF BIRTH:  1950-08-24  DATE OF ADMISSION:  10/26/2016  PRIMARY CARE PHYSICIAN: Tracie Harrier, MD   REQUESTING/REFERRING PHYSICIAN: Tama Gander MD CHIEF COMPLAINT:   Chief Complaint  Patient presents with  . Atrial Fibrillation  . Urinary Tract Infection  . flu like symptoms    HISTORY OF PRESENT ILLNESS: Ronnie Cunningham  is a 66 y.o. male with a known history of  Diabetes type 2, essential hypertension, seasonal allergies, sleep apnea and BPH with recurrent urinary tract infections who states that he hasn't been feeling well since Wednesday. He started having fevers chills flulike symptoms. He also stated that his urine was cloudy and had difficulty with urination. Has not had any nausea or vomiting. Denies any fevers or chills. Denies any chest pains or shortness of breath. Patient went to urgent care with the symptoms she was noted to have A. fib which is a new diagnosis for him patient came to the ER noted to have elevated WBC count and a urinary tract infection. He was in atrial fibrillation now converted to normal sinus rhythm.  PAST MEDICAL HISTORY:   Past Medical History:  Diagnosis Date  . Asthma   . BPH (benign prostatic hyperplasia)   . Diabetes mellitus without complication ([place])   . Hypertension   . Morbid obesity ([place])   . Seasonal allergies   . Sleep apnea   . Spasmodic dysphonia     PAST SURGICAL HISTORY:  Past Surgical History:  Procedure Laterality Date  . ADENOIDECTOMY    . CARDIAC SURGERY    . TONSILLECTOMY      SOCIAL HISTORY:  Social History  Substance Use Topics  . Smoking status: Former Research scientist (life sciences)  . Smokeless tobacco: Never Used  . Alcohol use 0.0 oz/week    FAMILY HISTORY:  Family History  Problem Relation Age of Onset  . Cancer Mother   . Hypertension Mother   . Cancer Father   . Hypertension Father     DRUG ALLERGIES:   Allergies  Allergen Reactions  . Pollen Extract Other (See Comments)  . Tape Other (See Comments)    REVIEW OF SYSTEMS:   CONSTITUTIONAL: No fever, Positive fatigue positive weakness.  EYES: No blurred or double vision.  EARS, NOSE, AND THROAT: No tinnitus or ear pain.  RESPIRATORY: No cough, shortness of breath, wheezing or hemoptysis.  CARDIOVASCULAR: No chest pain, orthopnea, edema.  GASTROINTESTINAL: No nausea, vomiting, diarrhea or abdominal pain.  GENITOURINARY: Positive dysuria, no hematuria.  ENDOCRINE: No polyuria, nocturia,  HEMATOLOGY: No anemia, easy bruising or bleeding SKIN: No rash or lesion. MUSCULOSKELETAL: No joint pain or arthritis.   NEUROLOGIC: No tingling, numbness, weakness.  PSYCHIATRY: No anxiety or depression.   MEDICATIONS AT HOME:  Prior to Admission medications   Medication Sig Start Date End Date Taking? Authorizing Provider  albuterol (PROAIR HFA) 108 (90 Base) MCG/ACT inhaler Inhale into the lungs. 11/12/11  Yes [provider]  ALPRAZolam (XANAX) 0.25 MG tablet Take 0.25 mg by mouth daily as needed.    Yes [provider]  amLODipine (NORVASC) 10 MG tablet Take 10 mg by mouth daily.    Yes [provider]  aspirin 325 MG tablet Take 325 mg by mouth daily.    Yes [provider]  atorvastatin (LIPITOR) 20 MG tablet Take 20 mg by mouth at bedtime.  04/01/12  Yes [provider]  bisoprolol-hydrochlorothiazide (ZIAC) 2.5-6.25 MG tablet daily.    Yes [provider]  Coenzyme Q10 (CO Q 10) 100 MG CAPS Take 2 capsules by mouth daily.    Yes [provider]  escitalopram (LEXAPRO) 20 MG tablet Take 20 mg by mouth daily.  04/05/14  Yes [provider]  finasteride (PROSCAR) 5 MG tablet Take 5 mg by mouth daily.  12/08/14  Yes [provider]  metFORMIN (GLUCOPHAGE) 500 MG tablet Take 500 mg by mouth 2 (two) times daily with a meal.    Yes [provider]  montelukast  (SINGULAIR) 10 MG tablet Take 10 mg by mouth daily as needed.  05/30/15  Yes [provider]  Multiple Vitamin (MULTIVITAMIN WITH MINERALS) TABS tablet Take 1 tablet by mouth daily.   Yes [provider]  pantoprazole (PROTONIX) 40 MG tablet Take by mouth. 04/13/15  Yes [provider]  tamsulosin (FLOMAX) 0.4 MG CAPS capsule Take 0.4 mg by mouth daily after supper.  05/19/12  Yes [provider]  traZODone (DESYREL) 50 MG tablet Take 50 mg by mouth at bedtime.   Yes [provider]  fluticasone (FLONASE) 50 MCG/ACT nasal spray Place 2 sprays into both nostrils daily.  12/08/14   [provider]  loratadine-pseudoephedrine (CLARITIN-D 24-HOUR) 10-240 MG 24 hr tablet Take by mouth. 04/12/15   [provider]      PHYSICAL EXAMINATION:   VITAL SIGNS: Blood pressure 113/76, pulse 77, temperature 98.3 F (36.8 C), temperature source Oral, resp. rate 19, weight 300 lb (136.1 kg), SpO2 99 %.  GENERAL:  66 y.o.-year-old patient lying in the bed with no acute distress.  EYES: Pupils equal, round, reactive to light and accommodation. No scleral icterus. Extraocular muscles intact.  HEENT: Head atraumatic, normocephalic. Oropharynx and nasopharynx clear.  NECK:  Supple, no jugular venous distention. No thyroid enlargement, no tenderness.  LUNGS: Normal breath sounds bilaterally, no wheezing, rales,rhonchi or crepitation. No use of accessory muscles of respiration.  CARDIOVASCULAR: S1, S2 normal. No murmurs, rubs, or gallops.  ABDOMEN: Soft, nontender, nondistended. Bowel sounds present. No organomegaly or mass.  EXTREMITIES: No pedal edema, cyanosis, or clubbing.  NEUROLOGIC: Cranial nerves II through XII are intact. Muscle strength 5/5 in all extremities. Sensation intact. Gait not checked.  PSYCHIATRIC: The patient is alert and oriented x 3.  SKIN: No obvious rash, lesion, or ulcer.   LABORATORY PANEL:   CBC  Recent Labs Lab  10/26/16 1130  WBC 19.7*  HGB 14.6  HCT 43.2  PLT 154  MCV 88.4  MCH 29.9  MCHC 33.8  RDW 13.3  LYMPHSABS 2.9  MONOABS 1.7*  EOSABS 0.0  BASOSABS 0.1   ------------------------------------------------------------------------------------------------------------------  Chemistries   Recent Labs Lab 10/26/16 1130  NA 142  K 3.6  CL 106  CO2 28  GLUCOSE 160*  BUN 14  CREATININE 0.98  CALCIUM 9.4  AST 19  ALT 22  ALKPHOS 56  BILITOT 0.5   ------------------------------------------------------------------------------------------------------------------ estimated creatinine clearance is 103 mL/min (by C-G formula based on SCr of 0.98 mg/dL). ------------------------------------------------------------------------------------------------------------------ No results for input(s): TSH, T4TOTAL, T3FREE, THYROIDAB in the last 72 hours.  Invalid input(s): FREET3   Coagulation profile No results for input(s): INR, PROTIME in the last 168 hours. ------------------------------------------------------------------------------------------------------------------- No results for input(s): DDIMER in the last 72 hours. -------------------------------------------------------------------------------------------------------------------  Cardiac Enzymes  Recent Labs Lab 10/26/16 1130  TROPONINI <0.03   ------------------------------------------------------------------------------------------------------------------ Invalid input(s): POCBNP  ---------------------------------------------------------------------------------------------------------------  Urinalysis    Component Value Date/Time   COLORURINE  YELLOW (A) 10/26/2016 1251   APPEARANCEUR CLEAR (A) 10/26/2016 1251   LABSPEC 1.005 10/26/2016 1251   PHURINE 6.0 10/26/2016 1251   GLUCOSEU NEGATIVE 10/26/2016 1251   HGBUR SMALL (A) 10/26/2016 1251   BILIRUBINUR NEGATIVE 10/26/2016 1251   KETONESUR NEGATIVE 10/26/2016  1251   PROTEINUR NEGATIVE 10/26/2016 1251   NITRITE NEGATIVE 10/26/2016 1251   LEUKOCYTESUR LARGE (A) 10/26/2016 1251     RADIOLOGY: Dg Chest 2 View  Result Date: 10/26/2016 CLINICAL DATA:  Chills, body aches, and flu-like illness for 3 days. Atrial fibrillation. EXAM: CHEST  2 VIEW COMPARISON:  03/11/2013 FINDINGS: The heart size and mediastinal contours are within normal limits. Both lungs are clear. The visualized skeletal structures are unremarkable. IMPRESSION: No active cardiopulmonary disease. Electronically Signed   By: Earle Gell M.D.   On: 10/26/2016 12:36    EKG: Orders placed or performed during the hospital encounter of 10/26/16  . EKG 12-Lead  . EKG 12-Lead  . EKG 12-Lead  . EKG 12-Lead    IMPRESSION AND PLAN: Patient is 66 year old presenting with flu like illness  1. Urinary tract infection We'll treat with IV antibiotics Follow urine culture Check a renal ultrasound to evaluate his bladder  2. Paroxysmal atrial fibrillation heart rate is now back in normal sinus rhythm also start him on aspirin and obtain echo ask his primary cardiologist to see  3. Diabetes type 2 Place on sliding scale insulin continue metformin  4. Sleep apnea continue CPAP  5.  Essential hypertension continue therapy with Norvasc  6. Miscellaneous Lovenox for DVT prophylaxis   All the records are reviewed and case discussed with ED provider. Management plans discussed with the patient, family and they are in agreement.  CODE STATUS: Code Status History    This patient does not have a recorded code status. Please follow your organizational policy for patients in this situation.       TOTAL TIME TAKING CARE OF THIS PATIENT: 55 minutes.    Dustin Flock M.D on 10/26/2016 at 2:59 PM  Between 7am to 6pm - Pager - [phone]  After 6pm go to [email or web address] - password [email or web address] [facility]  Office  [phone]  CC: Primary care physician; Tracie Harrier, MD

## 2016-10-26 NOTE — ED Notes (Signed)
Pt ambulated to the restroom with out difficulty.

## 2016-10-26 NOTE — Progress Notes (Signed)
Enoxaparin 40 mg SQ daily for DVT prophylaxis was ordered. Patient BMI >40 (=42.9), therefore enoxaparin dose was changed to enoxaparin 40 mg SQ twice daily.  (CrCl: 103 ml/min)   [place] Resident

## 2016-10-26 NOTE — ED Triage Notes (Signed)
Pt brought in by [facility] from [facility] urgent care for new onset A. Fib, UTI, and flu like symptoms. Pt denies any known history of A. Fib. States that he had fever and body aches that started about 4 days ago. Pt also notes that he has had dark, cloudy urine. Pt does not appear to be in any distress at this time.

## 2016-10-26 NOTE — ED Provider Notes (Signed)
[facility] ____________________________________________  Time seen: Approximately 10:00 AM  I have reviewed the triage vital signs and the nursing notes.   HISTORY  Chief Complaint Fever and Generalized Body Aches  HPI Ronnie Cunningham is a 66 y.o. male  past medical history of diabetes, hypertension, hyperlipidemia and laryngeal spasms presenting for evaluation of some right sided low back pain present for a few weeks, with 4-5 days of appearance of dark urine and decreased stream. Patient reports the last 3 days he has had accompanying body aches, chills and fever at home. States today slight burning with urination. Reports fever maximum 102 orally. States at 2 AM this morning temperature was 101.5 and that time took ibuprofen. States also has taken Tylenol this morning. Reports he believes he's had one previous urinary tract infection and a few prostate infections many years ago. Reports remote history of 1 kidney stone, denies any recurrence, but states does have some concern with accompanying right sided flank pain. Reports continues to drink fluids well.   Reports some nasal drainage consistent with his chronic sinus complaints, denies any other acute nasal changes, except some left ear discomfort. Denies accompanying chest pain, shortness of breath, palpitations, abdominal pain, nausea, vomiting or diarrhea. Denies any penile or testicular pain or tenderness. Denies penile drainage. Denies concerns of STDs.  Denies recent sickness. States last antibiotic use was 3-4 months ago for infected ingrown toe nail.  Reports other medical history as a left kidney cyst previously followed by urology. States evaluated previously by cardiology Dr. Kyra Searles, and had stress test and cardiac caths, no stents, and denies any arrhythmia history. Reports blood sugar last night just over 100.  PCP: [facility]   Past Medical History:  Diagnosis Date  . Asthma   . Diabetes mellitus without  complication ([place])   . Hypertension     Patient Active Problem List   Diagnosis Date Noted  . Morbid (severe) obesity due to excess calories ([place]) 04/12/2015  . Morbid obesity ([place]) 04/12/2015  . Borderline diabetes mellitus 02/22/2015  . BP (high blood pressure) 02/16/2014  . 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 Abnormal blood sugar 09/15/2013  . Chronic nonalcoholic liver disease 09/81/1914  . Essential (primary) hypertension 09/15/2013  . 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 H/O neoplasm 10/27/2012  . 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 Obstructive apnea 02/21/2012  . Controlled type 2 diabetes mellitus without complication ([place]) 78/29/5621  . Type 2 diabetes mellitus ([place]) 10/03/2011  . Acute antritis 07/23/2011  . Cough 07/23/2011  . HLD (hyperlipidemia) 07/23/2011  . Difficult or painful urination 06/11/2011  . Actinic keratoses 06/01/2010    Past Surgical History:  Procedure Laterality Date  . ADENOIDECTOMY    . CARDIAC SURGERY    . TONSILLECTOMY       No current facility-administered medications for this encounter.   Current Outpatient Prescriptions:  .  albuterol (PROAIR HFA) 108 (90 Base) MCG/ACT inhaler, Inhale into the lungs., Disp: , Rfl:  .  ALPRAZolam (XANAX) 0.25 MG tablet, Take 0.25 mg by mouth., Disp: , Rfl:  .  amLODipine (NORVASC) 10 MG tablet, Take 10 mg by mouth., Disp: , Rfl:  .  aspirin 325 MG tablet, Take by mouth., Disp: , Rfl:  .  atorvastatin (LIPITOR) 20 MG tablet, Take 20 mg by mouth., Disp: , Rfl:  .  bisoprolol-hydrochlorothiazide (ZIAC) 2.5-6.25 MG tablet, Take by mouth., Disp: , Rfl:  .  Blood Glucose Monitoring Suppl (FIFTY50 GLUCOSE METER 2.0) w/Device KIT, , Disp: ,  Rfl:  .  Coenzyme Q10 (CO Q 10) 10 MG CAPS, Take by mouth., Disp: , Rfl:  .  escitalopram (LEXAPRO) 20 MG tablet, Take 20 mg by mouth., Disp: , Rfl:  .  finasteride (PROSCAR) 5 MG tablet, Take by mouth., Disp: , Rfl:  .  fluticasone (FLONASE) 50 MCG/ACT  nasal spray, Place into the nose., Disp: , Rfl:  .  glucose blood (ONE TOUCH ULTRA TEST) test strip, , Disp: , Rfl:  .  loratadine-pseudoephedrine (CLARITIN-D 24-HOUR) 10-240 MG 24 hr tablet, Take by mouth., Disp: , Rfl:  .  metFORMIN (GLUCOPHAGE) 500 MG tablet, Take 500 mg by mouth., Disp: , Rfl:  .  montelukast (SINGULAIR) 10 MG tablet, Take by mouth., Disp: , Rfl:  .  Multiple Vitamin (MULTIVITAMIN) capsule, Take by mouth., Disp: , Rfl:  .  pantoprazole (PROTONIX) 40 MG tablet, Take by mouth., Disp: , Rfl:  .  tamsulosin (FLOMAX) 0.4 MG CAPS capsule, , Disp: , Rfl:  .  traZODone (DESYREL) 50 MG tablet, Take 50 mg by mouth at bedtime., Disp: , Rfl:  .  amLODipine (NORVASC) 5 MG tablet, Take 5 mg by mouth., Disp: , Rfl:  .  buPROPion (WELLBUTRIN SR) 150 MG 12 hr tablet, Take 150 mg by mouth., Disp: , Rfl:  .  cephALEXin (KEFLEX) 500 MG capsule, Take 1 capsule (500 mg total) by mouth 3 (three) times daily., Disp: 21 capsule, Rfl: 0 .  ciprofloxacin (CIPRO) 250 MG tablet, Take 250 mg by mouth., Disp: , Rfl:  .  dicyclomine (BENTYL) 10 MG capsule, Take by mouth., Disp: , Rfl:  .  escitalopram (LEXAPRO) 10 MG tablet, Take 10 mg by mouth., Disp: , Rfl:  .  hydrocortisone 2.5 % ointment, , Disp: , Rfl:  .  loratadine-pseudoephedrine (LORATADINE-D 12HR) 5-120 MG tablet, Take by mouth., Disp: , Rfl:  .  montelukast (SINGULAIR) 5 MG chewable tablet, Chew by mouth., Disp: , Rfl:  .  NIFEdipine (PROCARDIA XL) 30 MG 24 hr tablet, Take 30 mg by mouth., Disp: , Rfl:  .  pantoprazole (PROTONIX) 20 MG tablet, Take by mouth., Disp: , Rfl:  .  zolpidem (AMBIEN CR) 6.25 MG CR tablet, Take 6.25 mg by mouth., Disp: , Rfl:  .  zolpidem (AMBIEN) 10 MG tablet, Take by mouth., Disp: , Rfl:   Facility-Administered Medications Ordered in Other Encounters:  .  metoprolol tartrate (LOPRESSOR) injection 5 mg, 5 mg, Intravenous, Once, Nance Pear, MD .  sodium chloride 0.9 % bolus 500 mL, 500 mL, Intravenous, Once,  Nance Pear, MD  Allergies Pollen extract and Tape  Family History  Problem Relation Age of Onset  . Cancer Mother   . Hypertension Mother   . Cancer Father   . Hypertension Father    Mother: MI, CHF Father: MI, CAD  Social History Social History  Substance Use Topics  . Smoking status: Former Research scientist (life sciences)  . Smokeless tobacco: Never Used  . Alcohol use 0.0 oz/week    Review of Systems Constitutional: As above.  Eyes: No visual changes. ENT: No sore throat. Cardiovascular: Denies chest pain. Respiratory: Denies shortness of breath. Gastrointestinal: No abdominal pain.  No nausea, no vomiting.  No diarrhea.  No constipation. Genitourinary: positive for dysuria. Musculoskeletal: Positive right flank pain. Skin: Negative for rash. Neurological: Negative for focal weakness or numbness.   ____________________________________________   PHYSICAL EXAM:  VITAL SIGNS: ED Triage Vitals  Enc Vitals Group     BP 10/26/16 0854 115/64     Pulse Rate  10/26/16 0854 95     Resp 10/26/16 0854 18     Temp 10/26/16 0854 98.4 F (36.9 C)     Temp Source 10/26/16 0854 Oral     SpO2 10/26/16 0854 99 %     Weight 10/26/16 0856 300 lb (136.1 kg)     Height 10/26/16 0856 '5\' 10"'$  (1.778 m)     Head Circumference --      Peak Flow --      Pain Score 10/26/16 0859 0     Pain Loc --      Pain Edu? --      Excl. in [place]? --     Constitutional: Alert and oriented. Sickly appearing. ENT      Head: Normocephalic and atraumatic.      Ears: nontender, no erythema, normal TMs.      Nose: No congestion/rhinnorhea.      Mouth/Throat: Mucous membranes are moist.Oropharynx non-erythematous. Neck: No stridor. Supple without meningismus.  Hematological/Lymphatic/Immunilogical: No cervical lymphadenopathy. Cardiovascular: Irregularly irregular. Good peripheral circulation. Respiratory: Normal respiratory effort without tachypnea nor retractions. Mild scattered expiratory wheezes. No rhonchi.  Good air movement. Gastrointestinal: Soft and nontender. Obese abdominal. Umbilical hernia noted, nontender. Mild Right CVA tenderness. No left CVA tenderness. Prostate: declines chaperone. minimally tender prostate, enlarged.  Musculoskeletal:  No midline cervical, thoracic or lumbar tenderness to palpation.  Neurologic:  Normal speech and language. No gross focal neurologic deficits are appreciated. Speech is normal. No gait instability.  Skin:  Skin is warm, dry. Psychiatric: Mood and affect are normal. Speech and behavior are normal. Patient exhibits appropriate insight and judgment   ___________________________________________   LABS (all labs ordered are listed, but only abnormal results are displayed)  Labs Reviewed  URINALYSIS, COMPLETE (UACMP) WITH MICROSCOPIC - Abnormal; Notable for the following:       Result Value   APPearance CLOUDY (*)    Hgb urine dipstick TRACE (*)    Protein, ur 30 (*)    Nitrite POSITIVE (*)    Leukocytes, UA MODERATE (*)    Bacteria, UA MANY (*)    All other components within normal limits  URINE CULTURE   ____________________________________________  EKG  ED ECG REPORT I, Marylene Land, the attending provider, personally viewed and interpreted this ECG.   Date: 10/26/2016  EKG Time: 1021  Rate: 108  Rhythm:a.fib with rvr  Intervals:none  ST&T Change: T wave depression v4, v5  No ecg available for comparison found.   ____________________________________________  RADIOLOGY  No results found. ____________________________________________   PROCEDURES Procedures    INITIAL IMPRESSION / ASSESSMENT AND PLAN / ED COURSE  Pertinent labs & imaging results that were available during my care of the patient were reviewed by me and considered in my medical decision making (see chart for details).  Urinalysis reviewed, suspect complicated uti. Prostate minimally tender. Concern also pyelonephritis vs infection with renal stone vs  sepsis. However, noted irregular heart rate on exam. Patient declines history of same. ECG completed and noted a.fib New onset of a.fib with multiple of symptoms, recommend further evaluation in ER at this time. Recommend EMS transport. Patient agrees to this plan. Patient Stable at time of discharge. IC establish at urgent care. Brandy Camera operator at [facility] called and given report.  ____________________________________________   FINAL CLINICAL IMPRESSION(S) / ED DIAGNOSES  Final diagnoses:  Urinary tract infection with hematuria, site unspecified  Right flank pain  Atrial fibrillation, new onset [facility])     Discharge Medication List as of 10/26/2016  11:04 AM      Note: This dictation was prepared with Dragon dictation along with smaller phrase technology. Any transcriptional errors that result from this process are unintentional.         Marylene Land, NP 10/26/16 1203

## 2016-10-26 NOTE — ED Provider Notes (Signed)
[facility] Emergency Department Provider Note   ____________________________________________   I have reviewed the triage vital signs and the nursing notes.   HISTORY  Chief Complaint Atrial Fibrillation; Urinary Tract Infection; and flu like symptoms   History limited by: Not Limited   HPI Ronnie Cunningham is a 66 y.o. male who presents to the emergency department today via EMS from urgent care because of atrial fibrillation. Patient went to urgent care today with flu like symptoms. He has been having problems with fevers, congestion and body aches. These symptoms started three days ago. They have been constant since than. In addition the patient has noticed that his urine has been darker and he has had problems with prostrate and kidney infections in the past. The patient denies any history of atrial fibrillation. States that he is not aware that his heart is beating fast or abnormally.    Past Medical History:  Diagnosis Date  . Asthma   . Diabetes mellitus without complication ([place])   . Hypertension     Patient Active Problem List   Diagnosis Date Noted  . Morbid (severe) obesity due to excess calories ([place]) 04/12/2015  . Morbid obesity ([place]) 04/12/2015  . Borderline diabetes mellitus 02/22/2015  . BP (high blood pressure) 02/16/2014  . 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 Abnormal blood sugar 09/15/2013  . Chronic nonalcoholic liver disease 26/20/3559  . Essential (primary) hypertension 09/15/2013  . 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 H/O neoplasm 10/27/2012  . 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 Obstructive apnea 02/21/2012  . Controlled type 2 diabetes mellitus without complication ([place]) 74/16/3845  . Type 2 diabetes mellitus ([place]) 10/03/2011  . Acute antritis 07/23/2011  . Cough 07/23/2011  . HLD (hyperlipidemia) 07/23/2011  . Difficult or painful urination 06/11/2011  . Actinic keratoses 06/01/2010    Past Surgical  History:  Procedure Laterality Date  . ADENOIDECTOMY    . CARDIAC SURGERY    . TONSILLECTOMY      Prior to Admission medications   Medication Sig Start Date End Date Taking? Authorizing Provider  albuterol (PROAIR HFA) 108 (90 Base) MCG/ACT inhaler Inhale into the lungs. 11/12/11   [provider]  ALPRAZolam Duanne Moron) 0.25 MG tablet Take 0.25 mg by mouth.    [provider]  amLODipine (NORVASC) 10 MG tablet Take 10 mg by mouth.    [provider]  amLODipine (NORVASC) 5 MG tablet Take 5 mg by mouth. 04/06/14   [provider]  aspirin 325 MG tablet Take by mouth.    [provider]  atorvastatin (LIPITOR) 20 MG tablet Take 20 mg by mouth. 04/01/12   [provider]  bisoprolol-hydrochlorothiazide (ZIAC) 2.5-6.25 MG tablet Take by mouth.    [provider]  Blood Glucose Monitoring Suppl (FIFTY50 GLUCOSE METER 2.0) w/Device KIT  08/12/12   [provider]  buPROPion (WELLBUTRIN SR) 150 MG 12 hr tablet Take 150 mg by mouth. 10/27/02   [provider]  cephALEXin (KEFLEX) 500 MG capsule Take 1 capsule (500 mg total) by mouth 3 (three) times daily. 06/09/15   Landis Martins, DPM  ciprofloxacin (CIPRO) 250 MG tablet Take 250 mg by mouth.    [provider]  Coenzyme Q10 (CO Q 10) 10 MG CAPS Take by mouth.    [provider]  dicyclomine (BENTYL) 10 MG capsule Take by mouth. 11/18/14 11/18/15  [provider]  escitalopram (LEXAPRO) 10 MG tablet Take 10 mg by mouth.  [provider]  escitalopram (LEXAPRO) 20 MG tablet Take 20 mg by mouth. 04/05/14   [provider]  finasteride (PROSCAR) 5 MG tablet Take by mouth. 12/08/14   [provider]  fluticasone (FLONASE) 50 MCG/ACT nasal spray Place into the nose. 12/08/14   [provider]  glucose blood (ONE TOUCH ULTRA TEST) test strip  05/12/15   [provider]  hydrocortisone 2.5 % ointment  06/01/10    [provider]  loratadine-pseudoephedrine (CLARITIN-D 24-HOUR) 10-240 MG 24 hr tablet Take by mouth. 04/12/15   [provider]  loratadine-pseudoephedrine (LORATADINE-D 12HR) 5-120 MG tablet Take by mouth.    [provider]  metFORMIN (GLUCOPHAGE) 500 MG tablet Take 500 mg by mouth.    [provider]  montelukast (SINGULAIR) 10 MG tablet Take by mouth. 05/30/15   [provider]  montelukast (SINGULAIR) 5 MG chewable tablet Chew by mouth.    [provider]  Multiple Vitamin (MULTIVITAMIN) capsule Take by mouth.    [provider]  NIFEdipine (PROCARDIA XL) 30 MG 24 hr tablet Take 30 mg by mouth. 10/27/02   [provider]  pantoprazole (PROTONIX) 20 MG tablet Take by mouth.    [provider]  pantoprazole (PROTONIX) 40 MG tablet Take by mouth. 04/13/15   [provider]  tamsulosin (FLOMAX) 0.4 MG CAPS capsule  05/19/12   [provider]  traZODone (DESYREL) 50 MG tablet Take 50 mg by mouth at bedtime.    [provider]  zolpidem (AMBIEN CR) 6.25 MG CR tablet Take 6.25 mg by mouth.    [provider]  zolpidem (AMBIEN) 10 MG tablet Take by mouth. 02/07/15   [provider]    Allergies Pollen extract and Tape  Family History  Problem Relation Age of Onset  . Cancer Mother   . Hypertension Mother   . Cancer Father   . Hypertension Father     Social History Social History  Substance Use Topics  . Smoking status: Former Research scientist (life sciences)  . Smokeless tobacco: Never Used  . Alcohol use 0.0 oz/week    Review of Systems Constitutional: Positive for fevers. Eyes: No visual changes. ENT: Positive for sore throat.  Cardiovascular: Denies chest pain. Respiratory: Denies shortness of breath. Gastrointestinal: No abdominal pain.  No nausea, no vomiting.  No diarrhea.   Genitourinary: Positive for dark urine. Musculoskeletal: Positive for body aches.  Skin: Negative for  rash. Neurological: Negative for headaches, focal weakness or numbness.  ____________________________________________   PHYSICAL EXAM:  VITAL SIGNS: ED Triage Vitals  Enc Vitals Group     BP 10/26/16 1131 (!) 142/94     Pulse Rate 10/26/16 1131 (!) 113     Resp 10/26/16 1131 16     Temp 10/26/16 1131 98.3 F (36.8 C)     Temp Source 10/26/16 1131 Oral     SpO2 10/26/16 1131 99 %     Weight 10/26/16 1132 300 lb (136.1 kg)     Height --      Head Circumference --      Peak Flow --      Pain Score 10/26/16 1130 5     Pain Loc --      Pain Edu? --      Excl. in [place]? --      Constitutional: Alert and oriented. Well appearing and in no distress. Eyes: Conjunctivae are normal.  ENT   Head: Normocephalic and atraumatic.   Nose: No congestion/rhinnorhea.  Mouth/Throat: Mucous membranes are moist.   Neck: No stridor. Hematological/Lymphatic/Immunilogical: No cervical lymphadenopathy. Cardiovascular: Tachycardia, irregularly irregular rhythm.  Wheezing to right lung.  Respiratory: Normal respiratory effort without tachypnea nor retractions. Breath sounds are clear and equal bilaterally. No wheezes/rales/rhonchi. Gastrointestinal: Soft and non tender. No rebound. No guarding. Umbilical hernia.  Genitourinary: Deferred Musculoskeletal: Normal range of motion in all extremities. No lower extremity edema. Neurologic:  Normal speech and language. No gross focal neurologic deficits are appreciated.  Skin:  Skin is warm, dry and intact. No rash noted. Psychiatric: Mood and affect are normal. Speech and behavior are normal. Patient exhibits appropriate insight and judgment.  ____________________________________________    LABS (pertinent positives/negatives)  Labs Reviewed  CBC WITH DIFFERENTIAL/PLATELET - Abnormal; Notable for the following:       Result Value   WBC 19.7 (*)    Neutro Abs 15.0 (*)    Monocytes Absolute 1.7 (*)    All other components within normal  limits  COMPREHENSIVE METABOLIC PANEL - Abnormal; Notable for the following:    Glucose, Bld 160 (*)    All other components within normal limits  URINALYSIS, COMPLETE (UACMP) WITH MICROSCOPIC - Abnormal; Notable for the following:    Color, Urine YELLOW (*)    APPearance CLEAR (*)    Hgb urine dipstick SMALL (*)    Leukocytes, UA LARGE (*)    Bacteria, UA RARE (*)    All other components within normal limits  TROPONIN I  TSH     ____________________________________________   EKG  I, Nance Pear, attending physician, personally viewed and interpreted this EKG  EKG Time: 1139 Rate: 149 Rhythm: atrial fibrillation Axis: normal Intervals: qtc 441 QRS: narrow ST changes: no st elevation Impression: abnormal ekg   ____________________________________________    RADIOLOGY  CXR IMPRESSION:  No active cardiopulmonary disease.    ____________________________________________   PROCEDURES  Procedures  ____________________________________________   INITIAL IMPRESSION / ASSESSMENT AND PLAN / ED COURSE  Pertinent labs & imaging results that were available during my care of the patient were reviewed by me and considered in my medical decision making (see chart for details).  Patient presented to the emergency department today with new onset atrial fibrillation as well as flulike symptoms. Patient is workup did show that he had a urinary tract infection. Patient was given IV metoprolol to try to control the rate. This did help control the rate however during the course of the patient's stay he did convert back to a normal sinus rhythm. Will plan on admission for further work up and evaluation.  ____________________________________________   FINAL CLINICAL IMPRESSION(S) / ED DIAGNOSES  Final diagnoses:  Lower urinary tract infectious disease  Atrial fibrillation, unspecified type [facility])     Note: This dictation was prepared with Dragon dictation. Any  transcriptional errors that result from this process are unintentional     Nance Pear, MD 10/26/16 1432

## 2016-10-27 LAB — CBC
HCT: 38.3 % — ABNORMAL LOW (ref 40.0–52.0)
HEMOGLOBIN: 13 g/dL (ref 13.0–18.0)
MCH: 29.8 pg (ref 26.0–34.0)
MCHC: 34 g/dL (ref 32.0–36.0)
MCV: 87.5 fL (ref 80.0–100.0)
PLATELETS: 152 10*3/uL (ref 150–440)
RBC: 4.37 MIL/uL — AB (ref 4.40–5.90)
RDW: 13.2 % (ref 11.5–14.5)
WBC: 11.5 10*3/uL — AB (ref 3.8–10.6)

## 2016-10-27 LAB — BASIC METABOLIC PANEL
Anion gap: 6 (ref 5–15)
BUN: 14 mg/dL (ref 6–20)
CO2: 28 mmol/L (ref 22–32)
Calcium: 8.7 mg/dL — ABNORMAL LOW (ref 8.9–10.3)
Chloride: 111 mmol/L (ref 101–111)
Creatinine, Ser: 0.84 mg/dL (ref 0.61–1.24)
GFR calc Af Amer: >60 mL/min (ref >60)
GFR calc non Af Amer: >60 mL/min (ref >60)
Glucose, Bld: 133 mg/dL — ABNORMAL HIGH (ref 65–99)
Potassium: 3.3 mmol/L — ABNORMAL LOW (ref 3.5–5.1)
Sodium: 145 mmol/L (ref 135–145)

## 2016-10-27 LAB — GLUCOSE, CAPILLARY: GLUCOSE-CAPILLARY: 130 mg/dL — AB (ref 65–99)

## 2016-10-27 MED ORDER — CEPHALEXIN 500 MG PO CAPS: 500.0000 mg | ORAL_CAPSULE | Freq: Three times a day (TID) | ORAL | 0 refills | Status: DC

## 2016-10-27 MED ORDER — POTASSIUM CHLORIDE CRYS ER 20 MEQ PO TBCR
40.0000 meq | EXTENDED_RELEASE_TABLET | Freq: Once | ORAL | Status: AC
  Administered 2016-10-27: 40 meq via ORAL
  Filled 2016-10-27: qty 2

## 2016-10-27 NOTE — Discharge Summary (Signed)
[facility] [place] NAME: Ronnie Cunningham    MR#:  [id number]  DATE OF BIRTH:  Feb 17, 1951  DATE OF ADMISSION:  10/26/2016 ADMITTING PHYSICIAN: Dustin Flock, MD  DATE OF DISCHARGE: 10/27/2016  PRIMARY CARE PHYSICIAN: Tracie Harrier, MD    ADMISSION DIAGNOSIS:  Lower urinary tract infectious disease [N39.0] UTI (urinary tract infection) [N39.0] Atrial fibrillation, unspecified type ([place]) [I48.91]  DISCHARGE DIAGNOSIS:  UTI Rapid Afib with RVR---now in SR  SECONDARY DIAGNOSIS:   Past Medical History:  Diagnosis Date  . Asthma   . BPH (benign prostatic hyperplasia)   . Diabetes mellitus without complication ([place])   . Hypertension   . Morbid obesity ([place])   . Seasonal allergies   . Sleep apnea   . Spasmodic dysphonia     HOSPITAL COURSE:  Ronnie Cunningham  is a 66 y.o. male with a known history of  Diabetes type 2, essential hypertension, seasonal allergies, sleep apnea and BPH with recurrent urinary tract infections who states that he hasn't been feeling well since Wednesday. He started having fevers chills flulike symptoms. He also stated that his urine was cloudy and had difficulty with urination.   1. Urinary tract infection We'll treat with IV Rocephin---change to oral keflex for 7 days -renal ultrasound shows old renal cyst, no bladder mass -WBC 19K---11K  2. Rapid atrial fibrillation heart rate is now back in normal sinus rhythm also start him on aspirin - pt asymptomatic, HR in the 70-80's NSR -Seen by Dr Cammie Sickle -echo ordered -pt will f/u with DR fath as out pt  3. Diabetes type 2 Place on sliding scale insulin  -continue metformin  4. Sleep apnea continue CPAP  5.  Essential hypertension - continue therapy with Norvasc  6. Miscellaneous Lovenox for DVT prophylaxis  -overall stable -d/c home today  CONSULTS OBTAINED:  Treatment Team:  Corey Skains, MD  DRUG ALLERGIES:   Allergies   Allergen Reactions  . Pollen Extract Other (See Comments)  . Tape Other (See Comments)    DISCHARGE MEDICATIONS:   Current Discharge Medication List    START taking these medications   Details  cephALEXin (KEFLEX) 500 MG capsule Take 1 capsule (500 mg total) by mouth 3 (three) times daily. Qty: 21 capsule, Refills: 0      CONTINUE these medications which have NOT CHANGED   Details  albuterol (PROAIR HFA) 108 (90 Base) MCG/ACT inhaler Inhale into the lungs.    ALPRAZolam (XANAX) 0.25 MG tablet Take 0.25 mg by mouth daily as needed.     amLODipine (NORVASC) 10 MG tablet Take 10 mg by mouth daily.     aspirin 325 MG tablet Take 325 mg by mouth daily.     atorvastatin (LIPITOR) 20 MG tablet Take 20 mg by mouth at bedtime.     bisoprolol-hydrochlorothiazide (ZIAC) 2.5-6.25 MG tablet daily.     Coenzyme Q10 (CO Q 10) 100 MG CAPS Take 2 capsules by mouth daily.     escitalopram (LEXAPRO) 20 MG tablet Take 20 mg by mouth daily.     finasteride (PROSCAR) 5 MG tablet Take 5 mg by mouth daily.     metFORMIN (GLUCOPHAGE) 500 MG tablet Take 500 mg by mouth 2 (two) times daily with a meal.     montelukast (SINGULAIR) 10 MG tablet Take 10 mg by mouth daily as needed.     Multiple Vitamin (MULTIVITAMIN WITH MINERALS) TABS tablet Take 1 tablet by mouth daily.  pantoprazole (PROTONIX) 40 MG tablet Take by mouth.    tamsulosin (FLOMAX) 0.4 MG CAPS capsule Take 0.4 mg by mouth daily after supper.     traZODone (DESYREL) 50 MG tablet Take 50 mg by mouth at bedtime.    fluticasone (FLONASE) 50 MCG/ACT nasal spray Place 2 sprays into both nostrils daily.     loratadine-pseudoephedrine (CLARITIN-D 24-HOUR) 10-240 MG 24 hr tablet Take by mouth.        If you experience worsening of your admission symptoms, develop shortness of breath, life threatening emergency, suicidal or homicidal thoughts you must seek medical attention immediately by calling 911 or calling your MD immediately   if symptoms less severe.  You Must read complete instructions/literature along with all the possible adverse reactions/side effects for all the Medicines you take and that have been prescribed to you. Take any new Medicines after you have completely understood and accept all the possible adverse reactions/side effects.   Please note  You were cared for by a hospitalist during your hospital stay. If you have any questions about your discharge medications or the care you received while you were in the hospital after you are discharged, you can call the unit and asked to speak with the hospitalist on call if the hospitalist that took care of you is not available. Once you are discharged, your primary care physician will handle any further medical issues. Please note that NO REFILLS for any discharge medications will be authorized once you are discharged, as it is imperative that you return to your primary care physician (or establish a relationship with a primary care physician if you do not have one) for your aftercare needs so that they can reassess your need for medications and monitor your lab values. Today   SUBJECTIVE   Doing well. No new complaints  VITAL SIGNS:  Blood pressure 120/63, pulse 76, temperature 98.4 F (36.9 C), temperature source Oral, resp. rate 17, height 5\' 11"  (1.803 m), weight 134.3 kg (296 lb), SpO2 98 %.  I/O:   Intake/Output Summary (Last 24 hours) at 10/27/16 0831 Last data filed at 10/27/16 5809  Gross per 24 hour  Intake          2109.26 ml  Output              650 ml  Net          1459.26 ml    PHYSICAL EXAMINATION:  GENERAL:  66 y.o.-year-old patient lying in the bed with no acute distress. obese EYES: Pupils equal, round, reactive to light and accommodation. No scleral icterus. Extraocular muscles intact.  HEENT: Head atraumatic, normocephalic. Oropharynx and nasopharynx clear.  NECK:  Supple, no jugular venous distention. No thyroid enlargement, no  tenderness.  LUNGS: Normal breath sounds bilaterally, no wheezing, rales,rhonchi or crepitation. No use of accessory muscles of respiration.  CARDIOVASCULAR: S1, S2 normal. No murmurs, rubs, or gallops.  ABDOMEN: Soft, non-tender, non-distended. Bowel sounds present. No organomegaly or mass.  EXTREMITIES: No pedal edema, cyanosis, or clubbing.  NEUROLOGIC: Cranial nerves II through XII are intact. Muscle strength 5/5 in all extremities. Sensation intact. Gait not checked.  PSYCHIATRIC: The patient is alert and oriented x 3.  SKIN: No obvious rash, lesion, or ulcer.   DATA REVIEW:   CBC   Recent Labs Lab 10/27/16 0354  WBC 11.5*  HGB 13.0  HCT 38.3*  PLT 152    Chemistries   Recent Labs Lab 10/26/16 1130 10/27/16 0354  NA 142 145  K 3.6 3.3*  CL 106 111  CO2 28 28  GLUCOSE 160* 133*  BUN 14 14  CREATININE 0.98 0.84  CALCIUM 9.4 8.7*  AST 19  --   ALT 22  --   ALKPHOS 56  --   BILITOT 0.5  --     Microbiology Results   No results found for this or any previous visit (from the past 240 hour(s)).  RADIOLOGY:  Dg Chest 2 View  Result Date: 10/26/2016 CLINICAL DATA:  Chills, body aches, and flu-like illness for 3 days. Atrial fibrillation. EXAM: CHEST  2 VIEW COMPARISON:  03/11/2013 FINDINGS: The heart size and mediastinal contours are within normal limits. Both lungs are clear. The visualized skeletal structures are unremarkable. IMPRESSION: No active cardiopulmonary disease. Electronically Signed   By: Earle Gell M.D.   On: 10/26/2016 12:36   US Renal  Result Date: 10/26/2016 CLINICAL DATA:  Urinary tract infection EXAM: RENAL / URINARY TRACT ULTRASOUND COMPLETE COMPARISON:  CT 10/02/2012 FINDINGS: Right Kidney: Length: 12.1 seen. Echogenicity within normal limits. No mass or hydronephrosis visualized. Left Kidney: Length: 12.8 cm. Echogenicity within normal limits. There is an approximately 1.6 x 1.4 x 1.7 cm circumscribed hypoechoic cyst in the lower pole the left  kidney similar to that noted in 2014 consistent with a benign finding. Bladder: Appears normal for degree of bladder distention. No calculus, bladder mass or significant wall thickening. Other: The adjacent liver is slightly echogenic in appearance consistent fatty infiltration. IMPRESSION: 1. Fatty appearing liver. 2. Stable appearing left lower pole renal cyst measuring 1.6 x 1.4 x 1.7 cm. 3. No renal calculus nor obstruction. 4. No significant renal cortical thinning. Electronically Signed   By: Ashley Royalty M.D.   On: 10/26/2016 17:27     Management plans discussed with the patient, family and they are in agreement.  CODE STATUS:     Code Status Orders        Start     Ordered   10/26/16 1545  Full code  Continuous     10/26/16 1545    Code Status History    Date Active Date Inactive Code Status Order ID Comments User Context   This patient has a current code status but no historical code status.      TOTAL TIME TAKING CARE OF THIS PATIENT: *40* minutes.    Emerlyn Mehlhoff M.D on 10/27/2016 at 8:31 AM  Between 7am to 6pm - Pager - [phone] After 6pm go to [email or web address] - password [email or web address]  Office  [phone]  CC: Primary care physician; Tracie Harrier, MD

## 2016-10-27 NOTE — Care Management Obs Status (Signed)
[facility] NOTIFICATION   Patient Details  Name: Ronnie Cunningham MRN: [id number] Date of Birth: 12-10-1950   Medicare Observation Status Notification Given:  No    Discharged within 24 hours   Ehsan Corvin A, RN 10/27/2016, 9:20 AM

## 2016-10-27 NOTE — Consult Note (Signed)
[facility] Consultation Note  Patient ID: Ronnie Cunningham, MRN: [id number], DOB/AGE: 1950/06/13 66 y.o. Admit date: 10/26/2016   Date of Consult: 10/27/2016 Primary Physician: Tracie Harrier, MD Primary Cardiologist: None  Chief Complaint:  Chief Complaint  Patient presents with  . Atrial Fibrillation  . Urinary Tract Infection  . flu like symptoms   Reason for Consult: atrial fibrillation  HPI: 66 y.o. male with the known diabetes with complication essential hypertension sleep apnea having an acute onset of urinary tract infection and weakness and fatigue and noted to have atrial fibrillation with rapid ventricular rate by EKG on admission from the hospital ER. The patient spontaneously converted to normal sinus rhythm with appropriate medication management. Currently the patient is doing well with no further significant symptoms of chest discomfort shortness of breath or congestive heart failure. Patient is recovering fairly well from infection using the sleep CPAP machine appropriately and amlodipine for hypertension control.  Past Medical History:  Diagnosis Date  . Asthma   . BPH (benign prostatic hyperplasia)   . Diabetes mellitus without complication ([place])   . Hypertension   . Morbid obesity ([place])   . Seasonal allergies   . Sleep apnea   . Spasmodic dysphonia       Surgical History:  Past Surgical History:  Procedure Laterality Date  . ADENOIDECTOMY    . CARDIAC SURGERY    . TONSILLECTOMY       Home Meds: Prior to Admission medications   Medication Sig Start Date End Date Taking? Authorizing Provider  albuterol (PROAIR HFA) 108 (90 Base) MCG/ACT inhaler Inhale into the lungs. 11/12/11  Yes [provider]  ALPRAZolam (XANAX) 0.25 MG tablet Take 0.25 mg by mouth daily as needed.    Yes [provider]  amLODipine (NORVASC) 10 MG tablet Take 10 mg by mouth daily.    Yes [provider]  aspirin 325 MG tablet Take 325 mg by  mouth daily.    Yes [provider]  atorvastatin (LIPITOR) 20 MG tablet Take 20 mg by mouth at bedtime.  04/01/12  Yes [provider]  bisoprolol-hydrochlorothiazide (ZIAC) 2.5-6.25 MG tablet daily.    Yes [provider]  Coenzyme Q10 (CO Q 10) 100 MG CAPS Take 2 capsules by mouth daily.    Yes [provider]  escitalopram (LEXAPRO) 20 MG tablet Take 20 mg by mouth daily.  04/05/14  Yes [provider]  finasteride (PROSCAR) 5 MG tablet Take 5 mg by mouth daily.  12/08/14  Yes [provider]  metFORMIN (GLUCOPHAGE) 500 MG tablet Take 500 mg by mouth 2 (two) times daily with a meal.    Yes [provider]  montelukast (SINGULAIR) 10 MG tablet Take 10 mg by mouth daily as needed.  05/30/15  Yes [provider]  Multiple Vitamin (MULTIVITAMIN WITH MINERALS) TABS tablet Take 1 tablet by mouth daily.   Yes [provider]  pantoprazole (PROTONIX) 40 MG tablet Take by mouth. 04/13/15  Yes [provider]  tamsulosin (FLOMAX) 0.4 MG CAPS capsule Take 0.4 mg by mouth daily after supper.  05/19/12  Yes [provider]  traZODone (DESYREL) 50 MG tablet Take 50 mg by mouth at bedtime.   Yes [provider]  fluticasone (FLONASE) 50 MCG/ACT nasal spray Place 2 sprays into both nostrils daily.  12/08/14   [provider]  loratadine-pseudoephedrine (CLARITIN-D 24-HOUR) 10-240 MG 24 hr tablet Take by mouth. 04/12/15   [provider]  Inpatient Medications:  . amLODipine  10 mg Oral Daily  . aspirin  325 mg Oral Daily  . enoxaparin (LOVENOX) injection  40 mg Subcutaneous BID  . escitalopram  20 mg Oral Daily  . finasteride  5 mg Oral Daily  . fluticasone  2 spray Each Nare Daily  . insulin aspart  0-9 Units Subcutaneous TID WC  . loratadine  10 mg Oral Daily  . metFORMIN  500 mg Oral BID WC  . montelukast  10 mg Oral QHS  . multivitamin with minerals  1 tablet Oral Daily  .  pantoprazole  40 mg Oral Daily  . pseudoephedrine  120 mg Oral BID  . tamsulosin  0.4 mg Oral QPC supper  . traZODone  50 mg Oral QHS   . sodium chloride 100 mL/hr at 10/27/16 0318  . cefTRIAXone (ROCEPHIN)  IV      Allergies:  Allergies  Allergen Reactions  . Pollen Extract Other (See Comments)  . Tape Other (See Comments)    Social History   Social History  . Marital status: Married    Spouse name: N/A  . Number of children: N/A  . Years of education: N/A   Occupational History  . Not on file.   Social History Main Topics  . Smoking status: Former Research scientist (life sciences)  . Smokeless tobacco: Never Used  . Alcohol use 0.0 oz/week  . Drug use: No  . Sexual activity: Not on file   Other Topics Concern  . Not on file   Social History Narrative  . No narrative on file     Family History  Problem Relation Age of Onset  . Cancer Mother   . Hypertension Mother   . Cancer Father   . Hypertension Father      Review of Systems Positive for Urinary tract infection Negative for: General:  chills, fever, night sweats or weight changes.  Cardiovascular: PND orthopnea syncope dizziness  Dermatological skin lesions rashes Respiratory: Cough congestion Urologic: Frequent urination urination at night and hematuria Abdominal: negative for nausea, vomiting, diarrhea, bright red blood per rectum, melena, or hematemesis Neurologic: negative for visual changes, and/or hearing changes  All other systems reviewed and are otherwise negative except as noted above.  Labs:  Recent Labs  10/26/16 1130  TROPONINI <0.03   Lab Results  Component Value Date   WBC 11.5 (H) 10/27/2016   HGB 13.0 10/27/2016   HCT 38.3 (L) 10/27/2016   MCV 87.5 10/27/2016   PLT 152 10/27/2016    Recent Labs Lab 10/26/16 1130 10/27/16 0354  NA 142 145  K 3.6 3.3*  CL 106 111  CO2 28 28  BUN 14 14  CREATININE 0.98 0.84  CALCIUM 9.4 8.7*  PROT 6.7  --   BILITOT 0.5  --   ALKPHOS 56  --   ALT 22  --    AST 19  --   GLUCOSE 160* 133*   No results found for: CHOL, HDL, LDLCALC, TRIG No results found for: DDIMER  Radiology/Studies:  Dg Chest 2 View  Result Date: 10/26/2016 CLINICAL DATA:  Chills, body aches, and flu-like illness for 3 days. Atrial fibrillation. EXAM: CHEST  2 VIEW COMPARISON:  03/11/2013 FINDINGS: The heart size and mediastinal contours are within normal limits. Both lungs are clear. The visualized skeletal structures are unremarkable. IMPRESSION: No active cardiopulmonary disease. Electronically Signed   By: Earle Gell M.D.   On: 10/26/2016 12:36   US Renal  Result Date: 10/26/2016 CLINICAL DATA:  Urinary tract infection EXAM: RENAL / URINARY TRACT ULTRASOUND COMPLETE COMPARISON:  CT 10/02/2012 FINDINGS: Right Kidney: Length: 12.1 seen. Echogenicity within normal limits. No mass or hydronephrosis visualized. Left Kidney: Length: 12.8 cm. Echogenicity within normal limits. There is an approximately 1.6 x 1.4 x 1.7 cm circumscribed hypoechoic cyst in the lower pole the left kidney similar to that noted in 2014 consistent with a benign finding. Bladder: Appears normal for degree of bladder distention. No calculus, bladder mass or significant wall thickening. Other: The adjacent liver is slightly echogenic in appearance consistent fatty infiltration. IMPRESSION: 1. Fatty appearing liver. 2. Stable appearing left lower pole renal cyst measuring 1.6 x 1.4 x 1.7 cm. 3. No renal calculus nor obstruction. 4. No significant renal cortical thinning. Electronically Signed   By: Ashley Royalty M.D.   On: 10/26/2016 17:27    EKG: Atrial fibrillation with rapid ventricular rate and left jugular hypertrophy with repolarization  Weights: Filed Weights   10/26/16 1132 10/26/16 1754  Weight: 136.1 kg (300 lb) 134.3 kg (296 lb)     Physical Exam: Blood pressure 120/63, pulse 76, temperature 98.4 F (36.9 C), temperature source Oral, resp. rate 17, height 5\' 11"  (1.803 m), weight 134.3 kg (296  lb), SpO2 98 %. Body mass index is 41.28 kg/m. General: Well developed, well nourished, in no acute distress. Head eyes ears nose throat: Normocephalic, atraumatic, sclera non-icteric, no xanthomas, nares are without discharge. No apparent thyromegaly and/or mass  Lungs: Normal respiratory effort.  no wheezes, no rales, no rhonchi.  Heart: RRR with normal S1 S2. no murmur gallop, no rub, PMI is normal size and placement, carotid upstroke normal without bruit, jugular venous pressure is normal Abdomen: Soft, non-tender, non-distended with normoactive bowel sounds. No hepatomegaly. No rebound/guarding. No obvious abdominal masses. Abdominal aorta is normal size without bruit Extremities: No edema. no cyanosis, no clubbing, no ulcers  Peripheral : 2+ bilateral upper extremity pulses, 2+ bilateral femoral pulses, 2+ bilateral dorsal pedal pulse Neuro: Alert and oriented. No facial asymmetry. No focal deficit. Moves all extremities spontaneously. Musculoskeletal: Normal muscle tone without kyphosis Psych:  Responds to questions appropriately with a normal affect.    Assessment: 66 year old male with apparent no urinary tract infection sleep apnea essential hypertension diabetes with complication having acute onset of the atrial fibrillation with rapid ventricular rate now spontaneously converted to normal sinus rhythm  Plan: 1. Continue supportive care of urinary tract infection other causes of weakness and fatigue 2. Continue aggressive treatment of CPAP machine for sleep apnea 3. Amlodipine for hypertension control 4. Further consideration of addition of beta blocker for continued maintenance of normal sinus rhythm and treatment of hypertension control 5. Begin ambulation and follow for improvements of symptoms and further assessment as an outpatient for need for anticoagulation  Signed, Corey Skains M.D. [facility] 10/27/2016, 7:32 AM

## 2016-10-27 NOTE — Progress Notes (Signed)
[name] to be D/C'd Home per MD order.  Discussed prescriptions and follow up appointments with the patient. Prescriptions given to patient, medication list explained in detail. Pt verbalized understanding.  Allergies as of 10/27/2016      Reactions   Pollen Extract Other (See Comments)   Tape Other (See Comments)      Medication List    TAKE these medications   ALPRAZolam 0.25 MG tablet Commonly known as:  XANAX Take 0.25 mg by mouth daily as needed.   amLODipine 10 MG tablet Commonly known as:  NORVASC Take 10 mg by mouth daily.   aspirin 325 MG tablet Take 325 mg by mouth daily.   atorvastatin 20 MG tablet Commonly known as:  LIPITOR Take 20 mg by mouth at bedtime.   bisoprolol-hydrochlorothiazide 2.5-6.25 MG tablet Commonly known as:  ZIAC daily.   cephALEXin 500 MG capsule Commonly known as:  KEFLEX Take 1 capsule (500 mg total) by mouth 3 (three) times daily.   Co Q 10 100 MG Caps Take 2 capsules by mouth daily.   escitalopram 20 MG tablet Commonly known as:  LEXAPRO Take 20 mg by mouth daily.   finasteride 5 MG tablet Commonly known as:  PROSCAR Take 5 mg by mouth daily.   fluticasone 50 MCG/ACT nasal spray Commonly known as:  FLONASE Place 2 sprays into both nostrils daily.   loratadine-pseudoephedrine 10-240 MG 24 hr tablet Commonly known as:  CLARITIN-D 24-hour Take by mouth.   metFORMIN 500 MG tablet Commonly known as:  GLUCOPHAGE Take 500 mg by mouth 2 (two) times daily with a meal.   montelukast 10 MG tablet Commonly known as:  SINGULAIR Take 10 mg by mouth daily as needed.   multivitamin with minerals Tabs tablet Take 1 tablet by mouth daily.   pantoprazole 40 MG tablet Commonly known as:  PROTONIX Take by mouth.   PROAIR HFA 108 (90 Base) MCG/ACT inhaler Generic drug:  albuterol Inhale into the lungs.   tamsulosin 0.4 MG Caps capsule Commonly known as:  FLOMAX Take 0.4 mg by mouth daily after supper.   traZODone 50 MG  tablet Commonly known as:  DESYREL Take 50 mg by mouth at bedtime.            Discharge Care Instructions        Start     Ordered   10/27/16 0000  cephALEXin (KEFLEX) 500 MG capsule  3 times daily     10/27/16 0830   10/27/16 0000  Increase activity slowly     10/27/16 0830   10/27/16 0000  Diet - low sodium heart healthy     10/27/16 0830      Vitals:   10/27/16 0418 10/27/16 0929  BP: 120/63 (!) 111/58  Pulse: 76 75  Resp: 17   Temp: 98.4 F (36.9 C)   SpO2: 98%     Skin clean, dry and intact without evidence of skin break down, no evidence of skin tears noted. IV catheter discontinued intact. Site without signs and symptoms of complications. Dressing and pressure applied. Pt denies pain at this time. No complaints noted.  An After Visit Summary was printed and given to the patient. Patient escorted via [place], and D/C home via private auto.  Francesco Sor

## 2016-10-28 ENCOUNTER — Emergency Department: Payer: Medicare HMO

## 2016-10-28 ENCOUNTER — Encounter: Payer: Self-pay | Admitting: Medical Oncology

## 2016-10-28 ENCOUNTER — Observation Stay
Admission: EM | Admit: 2016-10-28 | Discharge: 2016-10-29 | Disposition: A | Discharge status: Home / Self Care | Payer: Medicare HMO | Attending: Internal Medicine | Admitting: Internal Medicine

## 2016-10-28 DIAGNOSIS — Z7951 Long term (current) use of inhaled steroids: Secondary | ICD-10-CM | POA: Diagnosis not present

## 2016-10-28 DIAGNOSIS — Z79899 Other long term (current) drug therapy: Secondary | ICD-10-CM | POA: Diagnosis not present

## 2016-10-28 DIAGNOSIS — N400 Enlarged prostate without lower urinary tract symptoms: Secondary | ICD-10-CM | POA: Insufficient documentation

## 2016-10-28 DIAGNOSIS — E876 Hypokalemia: Secondary | ICD-10-CM | POA: Diagnosis not present

## 2016-10-28 DIAGNOSIS — J45909 Unspecified asthma, uncomplicated: Secondary | ICD-10-CM | POA: Insufficient documentation

## 2016-10-28 DIAGNOSIS — F419 Anxiety disorder, unspecified: Secondary | ICD-10-CM | POA: Insufficient documentation

## 2016-10-28 DIAGNOSIS — Z6841 Body Mass Index (BMI) 40.0 and over, adult: Secondary | ICD-10-CM | POA: Insufficient documentation

## 2016-10-28 DIAGNOSIS — G4733 Obstructive sleep apnea (adult) (pediatric): Secondary | ICD-10-CM | POA: Diagnosis not present

## 2016-10-28 DIAGNOSIS — F329 Major depressive disorder, single episode, unspecified: Secondary | ICD-10-CM | POA: Diagnosis not present

## 2016-10-28 DIAGNOSIS — E119 Type 2 diabetes mellitus without complications: Secondary | ICD-10-CM | POA: Diagnosis not present

## 2016-10-28 DIAGNOSIS — Z7984 Long term (current) use of oral hypoglycemic drugs: Secondary | ICD-10-CM | POA: Insufficient documentation

## 2016-10-28 DIAGNOSIS — L03032 Cellulitis of left toe: Secondary | ICD-10-CM | POA: Diagnosis not present

## 2016-10-28 DIAGNOSIS — R531 Weakness: Secondary | ICD-10-CM | POA: Diagnosis not present

## 2016-10-28 DIAGNOSIS — Z7982 Long term (current) use of aspirin: Secondary | ICD-10-CM | POA: Insufficient documentation

## 2016-10-28 DIAGNOSIS — I4891 Unspecified atrial fibrillation: Secondary | ICD-10-CM | POA: Diagnosis present

## 2016-10-28 DIAGNOSIS — G4730 Sleep apnea, unspecified: Secondary | ICD-10-CM | POA: Diagnosis not present

## 2016-10-28 DIAGNOSIS — N281 Cyst of kidney, acquired: Secondary | ICD-10-CM | POA: Insufficient documentation

## 2016-10-28 DIAGNOSIS — N390 Urinary tract infection, site not specified: Secondary | ICD-10-CM | POA: Insufficient documentation

## 2016-10-28 DIAGNOSIS — Z9049 Acquired absence of other specified parts of digestive tract: Secondary | ICD-10-CM | POA: Diagnosis not present

## 2016-10-28 DIAGNOSIS — Z87891 Personal history of nicotine dependence: Secondary | ICD-10-CM | POA: Insufficient documentation

## 2016-10-28 DIAGNOSIS — I10 Essential (primary) hypertension: Secondary | ICD-10-CM | POA: Diagnosis not present

## 2016-10-28 DIAGNOSIS — L600 Ingrowing nail: Secondary | ICD-10-CM | POA: Diagnosis not present

## 2016-10-28 DIAGNOSIS — K219 Gastro-esophageal reflux disease without esophagitis: Secondary | ICD-10-CM | POA: Diagnosis not present

## 2016-10-28 DIAGNOSIS — Z9889 Other specified postprocedural states: Secondary | ICD-10-CM | POA: Insufficient documentation

## 2016-10-28 DIAGNOSIS — I480 Paroxysmal atrial fibrillation: Secondary | ICD-10-CM | POA: Diagnosis not present

## 2016-10-28 LAB — CBC
HEMATOCRIT: 43.4 % (ref 40.0–52.0)
Hemoglobin: 14.7 g/dL (ref 13.0–18.0)
MCH: 29.7 pg (ref 26.0–34.0)
MCHC: 33.9 g/dL (ref 32.0–36.0)
MCV: 87.7 fL (ref 80.0–100.0)
PLATELETS: 192 10*3/uL (ref 150–440)
RBC: 4.96 MIL/uL (ref 4.40–5.90)
RDW: 13.4 % (ref 11.5–14.5)
WBC: 7.7 10*3/uL (ref 3.8–10.6)

## 2016-10-28 LAB — BASIC METABOLIC PANEL
Anion gap: 9 (ref 5–15)
BUN: 12 mg/dL (ref 6–20)
CHLORIDE: 107 mmol/L (ref 101–111)
CO2: 25 mmol/L (ref 22–32)
CREATININE: 0.95 mg/dL (ref 0.61–1.24)
Calcium: 8.7 mg/dL — ABNORMAL LOW (ref 8.9–10.3)
GFR calc Af Amer: >60 mL/min (ref >60)
GFR calc non Af Amer: >60 mL/min (ref >60)
GLUCOSE: 170 mg/dL — AB (ref 65–99)
POTASSIUM: 3.3 mmol/L — AB (ref 3.5–5.1)
Sodium: 141 mmol/L (ref 135–145)

## 2016-10-28 LAB — GLUCOSE, CAPILLARY
GLUCOSE-CAPILLARY: 128 mg/dL — AB (ref 65–99)
GLUCOSE-CAPILLARY: 112 mg/dL — AB (ref 65–99)

## 2016-10-28 LAB — URINE CULTURE

## 2016-10-28 LAB — TROPONIN I: Troponin I: 0.03 ng/mL (ref <0.03)

## 2016-10-28 MED ORDER — ACETAMINOPHEN 650 MG RE SUPP: 650.0000 mg | Freq: Four times a day (QID) | RECTAL | Status: DC | PRN

## 2016-10-28 MED ORDER — METOPROLOL TARTRATE 5 MG/5ML IV SOLN
10.0000 mg | Freq: Once | INTRAVENOUS | Status: AC
  Administered 2016-10-28: 10 mg via INTRAVENOUS

## 2016-10-28 MED ORDER — CO Q 10 100 MG PO CAPS: 2.0000 | ORAL_CAPSULE | Freq: Every day | ORAL | Status: DC

## 2016-10-28 MED ORDER — POTASSIUM CHLORIDE IN NACL 20-0.9 MEQ/L-% IV SOLN
INTRAVENOUS | Status: DC
  Administered 2016-10-28 – 2016-10-29 (×2): via INTRAVENOUS
  Filled 2016-10-28 (×2): qty 1000

## 2016-10-28 MED ORDER — AMLODIPINE BESYLATE 5 MG PO TABS
5.0000 mg | ORAL_TABLET | Freq: Every day | ORAL | Status: DC
  Administered 2016-10-28: 5 mg via ORAL
  Filled 2016-10-28: qty 1

## 2016-10-28 MED ORDER — ESCITALOPRAM OXALATE 10 MG PO TABS
20.0000 mg | ORAL_TABLET | Freq: Every day | ORAL | Status: DC
  Administered 2016-10-28 – 2016-10-29 (×2): 20 mg via ORAL
  Filled 2016-10-28: qty 2
  Filled 2016-10-28: qty 1

## 2016-10-28 MED ORDER — ALBUTEROL SULFATE (2.5 MG/3ML) 0.083% IN NEBU: 2.5000 mg | INHALATION_SOLUTION | RESPIRATORY_TRACT | Status: DC | PRN

## 2016-10-28 MED ORDER — POLYETHYLENE GLYCOL 3350 17 G PO PACK: 17.0000 g | PACK | Freq: Every day | ORAL | Status: DC | PRN

## 2016-10-28 MED ORDER — INSULIN ASPART 100 UNIT/ML ~~LOC~~ SOLN
0.0000 [IU] | Freq: Three times a day (TID) | SUBCUTANEOUS | Status: DC
Start: 2016-10-28 — End: 2016-10-29
  Administered 2016-10-28 – 2016-10-29 (×2): 1 [IU] via SUBCUTANEOUS
  Filled 2016-10-28 (×2): qty 1

## 2016-10-28 MED ORDER — DILTIAZEM HCL 100 MG IV SOLR
5.0000 mg/h | Freq: Once | INTRAVENOUS | Status: AC
  Administered 2016-10-28: 5 mg/h via INTRAVENOUS
  Filled 2016-10-28: qty 100

## 2016-10-28 MED ORDER — LORATADINE-PSEUDOEPHEDRINE ER 10-240 MG PO TB24: 1.0000 | ORAL_TABLET | Freq: Every day | ORAL | Status: DC

## 2016-10-28 MED ORDER — ADULT MULTIVITAMIN W/MINERALS CH
1.0000 | ORAL_TABLET | Freq: Every day | ORAL | Status: DC
  Administered 2016-10-28 – 2016-10-29 (×2): 1 via ORAL
  Filled 2016-10-28 (×2): qty 1

## 2016-10-28 MED ORDER — BISOPROLOL FUMARATE 5 MG PO TABS
2.5000 mg | ORAL_TABLET | Freq: Every day | ORAL | Status: DC
  Administered 2016-10-28: 2.5 mg via ORAL
  Filled 2016-10-28: qty 0.5

## 2016-10-28 MED ORDER — METOPROLOL TARTRATE 25 MG PO TABS
25.0000 mg | ORAL_TABLET | Freq: Two times a day (BID) | ORAL | Status: DC
  Administered 2016-10-28 – 2016-10-29 (×2): 25 mg via ORAL
  Filled 2016-10-28 (×2): qty 1

## 2016-10-28 MED ORDER — FINASTERIDE 5 MG PO TABS
5.0000 mg | ORAL_TABLET | Freq: Every day | ORAL | Status: DC
  Administered 2016-10-28 – 2016-10-29 (×2): 5 mg via ORAL
  Filled 2016-10-28 (×2): qty 1

## 2016-10-28 MED ORDER — ALPRAZOLAM 0.25 MG PO TABS: 0.2500 mg | ORAL_TABLET | Freq: Every day | ORAL | Status: DC | PRN

## 2016-10-28 MED ORDER — ALBUTEROL SULFATE HFA 108 (90 BASE) MCG/ACT IN AERS: 2.0000 | INHALATION_SPRAY | RESPIRATORY_TRACT | Status: DC | PRN

## 2016-10-28 MED ORDER — ENOXAPARIN SODIUM 40 MG/0.4ML ~~LOC~~ SOLN
40.0000 mg | SUBCUTANEOUS | Status: DC
  Administered 2016-10-28: 40 mg via SUBCUTANEOUS
  Filled 2016-10-28: qty 0.4

## 2016-10-28 MED ORDER — HYDROCHLOROTHIAZIDE 10 MG/ML ORAL SUSPENSION
6.2500 mg | Freq: Every day | ORAL | Status: DC
  Administered 2016-10-28: 6.25 mg via ORAL
  Filled 2016-10-28 (×3): qty 1.25

## 2016-10-28 MED ORDER — PANTOPRAZOLE SODIUM 40 MG PO TBEC
40.0000 mg | DELAYED_RELEASE_TABLET | Freq: Every day | ORAL | Status: DC
  Administered 2016-10-28 – 2016-10-29 (×2): 40 mg via ORAL
  Filled 2016-10-28 (×2): qty 1

## 2016-10-28 MED ORDER — METOPROLOL TARTRATE 5 MG/5ML IV SOLN
5.0000 mg | Freq: Once | INTRAVENOUS | Status: AC
  Administered 2016-10-28: 5 mg via INTRAVENOUS
  Filled 2016-10-28: qty 5

## 2016-10-28 MED ORDER — BISOPROLOL-HYDROCHLOROTHIAZIDE 2.5-6.25 MG PO TABS: 1.0000 | ORAL_TABLET | Freq: Every day | ORAL | Status: DC

## 2016-10-28 MED ORDER — ATORVASTATIN CALCIUM 20 MG PO TABS
20.0000 mg | ORAL_TABLET | Freq: Every day | ORAL | Status: DC
  Administered 2016-10-28: 20 mg via ORAL
  Filled 2016-10-28: qty 1

## 2016-10-28 MED ORDER — INSULIN ASPART 100 UNIT/ML ~~LOC~~ SOLN: 0.0000 [IU] | Freq: Every day | SUBCUTANEOUS | Status: DC

## 2016-10-28 MED ORDER — METFORMIN HCL 500 MG PO TABS
500.0000 mg | ORAL_TABLET | Freq: Two times a day (BID) | ORAL | Status: DC
  Administered 2016-10-28 – 2016-10-29 (×2): 500 mg via ORAL
  Filled 2016-10-28 (×2): qty 1

## 2016-10-28 MED ORDER — MONTELUKAST SODIUM 10 MG PO TABS
10.0000 mg | ORAL_TABLET | Freq: Every day | ORAL | Status: DC
  Administered 2016-10-28: 10 mg via ORAL
  Filled 2016-10-28: qty 1

## 2016-10-28 MED ORDER — TAMSULOSIN HCL 0.4 MG PO CAPS
0.4000 mg | ORAL_CAPSULE | Freq: Every day | ORAL | Status: DC
  Administered 2016-10-28: 0.4 mg via ORAL
  Filled 2016-10-28: qty 1

## 2016-10-28 MED ORDER — CEPHALEXIN 500 MG PO CAPS
500.0000 mg | ORAL_CAPSULE | Freq: Three times a day (TID) | ORAL | Status: DC
  Administered 2016-10-28 (×2): 500 mg via ORAL
  Filled 2016-10-28 (×2): qty 1

## 2016-10-28 MED ORDER — SODIUM CHLORIDE 0.9 % IV BOLUS (SEPSIS)
1000.0000 mL | Freq: Once | INTRAVENOUS | Status: AC
  Administered 2016-10-28: 1000 mL via INTRAVENOUS

## 2016-10-28 MED ORDER — DILTIAZEM HCL ER COATED BEADS 120 MG PO CP24
240.0000 mg | ORAL_CAPSULE | Freq: Every day | ORAL | Status: DC
  Administered 2016-10-28 – 2016-10-29 (×2): 240 mg via ORAL
  Filled 2016-10-28 (×2): qty 2

## 2016-10-28 MED ORDER — TRAZODONE HCL 50 MG PO TABS
50.0000 mg | ORAL_TABLET | Freq: Every day | ORAL | Status: DC
  Administered 2016-10-28: 50 mg via ORAL
  Filled 2016-10-28: qty 1

## 2016-10-28 MED ORDER — ACETAMINOPHEN 325 MG PO TABS: 650.0000 mg | ORAL_TABLET | Freq: Four times a day (QID) | ORAL | Status: DC | PRN

## 2016-10-28 MED ORDER — FLUTICASONE PROPIONATE 50 MCG/ACT NA SUSP
2.0000 | Freq: Every day | NASAL | Status: DC
  Administered 2016-10-28 – 2016-10-29 (×2): 2 via NASAL
  Filled 2016-10-28: qty 16

## 2016-10-28 MED ORDER — ASPIRIN 325 MG PO TABS
325.0000 mg | ORAL_TABLET | Freq: Every day | ORAL | Status: DC
  Administered 2016-10-28 – 2016-10-29 (×2): 325 mg via ORAL
  Filled 2016-10-28 (×2): qty 1

## 2016-10-28 MED ORDER — TRAMADOL HCL 50 MG PO TABS
50.0000 mg | ORAL_TABLET | Freq: Four times a day (QID) | ORAL | Status: DC | PRN
  Administered 2016-10-28: 50 mg via ORAL
  Filled 2016-10-28: qty 1

## 2016-10-28 MED ORDER — METOPROLOL TARTRATE 5 MG/5ML IV SOLN
INTRAVENOUS | Status: AC
  Administered 2016-10-28: 10 mg via INTRAVENOUS
  Filled 2016-10-28: qty 10

## 2016-10-28 NOTE — ED Triage Notes (Signed)
Pt reports he was discharged yesterday from hospital with new onset afib and UTI. Pt reports this am he began to feel like his heart was racing again. Pt denies chest pain.

## 2016-10-28 NOTE — ED Notes (Signed)
Date and time results received: 10/28/16 1131 (use smartphrase ".now" to insert current time)  Test: Troponin Critical Value: 0.03  Name of Provider Notified: Dr.Padowski  Orders Received? Or Actions Taken?: no

## 2016-10-28 NOTE — ED Notes (Signed)
Pt given warm blanket and ice water. Water ok'ed by MD

## 2016-10-28 NOTE — ED Provider Notes (Signed)
[facility] Emergency Department Provider Note  Time seen: 11:31 AM  I have reviewed the triage vital signs and the nursing notes.   HISTORY  Chief Complaint Tachycardia    HPI Ronnie Cunningham is a 66 y.o. male patient presents to the emergency department for dizziness and lightheadedness. Patient states and record review shows he was discharged from the hospital yesterday after being admitted for urinary tract infection and new onset atrial fibrillation with rapid ventricular response. Patient had converted back to normal sinus rhythm and was discharged on Keflex. Patient once again felt very dizzy lightheaded this morning with some chest tightness. Patient was found to be in atrial fibrillation with rapid ventricular response in the 150s. Denies any chest pain. Does state shortness of breath worse with exertion.  Past Medical History:  Diagnosis Date  . Asthma   . BPH (benign prostatic hyperplasia)   . Diabetes mellitus without complication ([place])   . Hypertension   . Morbid obesity ([place])   . Seasonal allergies   . Sleep apnea   . Spasmodic dysphonia     Patient Active Problem List   Diagnosis Date Noted  . UTI (urinary tract infection) 10/26/2016  . Morbid (severe) obesity due to excess calories ([place]) 04/12/2015  . Morbid obesity ([place]) 04/12/2015  . Borderline diabetes mellitus 02/22/2015  . BP (high blood pressure) 02/16/2014  . 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 Abnormal blood sugar 09/15/2013  . Chronic nonalcoholic liver disease 93/26/7124  . Essential (primary) hypertension 09/15/2013  . 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 H/O neoplasm 10/27/2012  . 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 Obstructive apnea 02/21/2012  . Controlled type 2 diabetes mellitus without complication ([place]) 58/11/9831  . Type 2 diabetes mellitus ([place]) 10/03/2011  . Acute antritis 07/23/2011  . Cough 07/23/2011  . HLD (hyperlipidemia) 07/23/2011  .  Difficult or painful urination 06/11/2011  . Actinic keratoses 06/01/2010    Past Surgical History:  Procedure Laterality Date  . ADENOIDECTOMY    . CARDIAC SURGERY    . TONSILLECTOMY      Prior to Admission medications   Medication Sig Start Date End Date Taking? Authorizing Provider  albuterol (PROAIR HFA) 108 (90 Base) MCG/ACT inhaler Inhale into the lungs. 11/12/11   [provider]  ALPRAZolam Duanne Moron) 0.25 MG tablet Take 0.25 mg by mouth daily as needed.     [provider]  amLODipine (NORVASC) 10 MG tablet Take 10 mg by mouth daily.     [provider]  aspirin 325 MG tablet Take 325 mg by mouth daily.     [provider]  atorvastatin (LIPITOR) 20 MG tablet Take 20 mg by mouth at bedtime.  04/01/12   [provider]  bisoprolol-hydrochlorothiazide (ZIAC) 2.5-6.25 MG tablet daily.     [provider]  cephALEXin (KEFLEX) 500 MG capsule Take 1 capsule (500 mg total) by mouth 3 (three) times daily. 10/27/16 11/03/16  Fritzi Mandes, MD  Coenzyme Q10 (CO Q 10) 100 MG CAPS Take 2 capsules by mouth daily.     [provider]  escitalopram (LEXAPRO) 20 MG tablet Take 20 mg by mouth daily.  04/05/14   [provider]  finasteride (PROSCAR) 5 MG tablet Take 5 mg by mouth daily.  12/08/14   [provider]  fluticasone (FLONASE) 50 MCG/ACT nasal spray Place 2 sprays into both nostrils daily.  12/08/14   [provider]  loratadine-pseudoephedrine (CLARITIN-D 24-HOUR) 10-240 MG 24 hr tablet Take  by mouth. 04/12/15   [provider]  metFORMIN (GLUCOPHAGE) 500 MG tablet Take 500 mg by mouth 2 (two) times daily with a meal.     [provider]  montelukast (SINGULAIR) 10 MG tablet Take 10 mg by mouth daily as needed.  05/30/15   [provider]  Multiple Vitamin (MULTIVITAMIN WITH MINERALS) TABS tablet Take 1 tablet by mouth daily.    [provider]  pantoprazole (PROTONIX) 40 MG  tablet Take by mouth. 04/13/15   [provider]  tamsulosin (FLOMAX) 0.4 MG CAPS capsule Take 0.4 mg by mouth daily after supper.  05/19/12   [provider]  traZODone (DESYREL) 50 MG tablet Take 50 mg by mouth at bedtime.    [provider]    Allergies  Allergen Reactions  . Pollen Extract Other (See Comments)  . Tape Other (See Comments)    Family History  Problem Relation Age of Onset  . Cancer Mother   . Hypertension Mother   . Cancer Father   . Hypertension Father     Social History Social History  Substance Use Topics  . Smoking status: Former Research scientist (life sciences)  . Smokeless tobacco: Never Used  . Alcohol use 0.0 oz/week    Review of Systems Constitutional: Negative for fever.Positive for dizziness/lightheadedness Cardiovascular: Negative for chest pain. Respiratory: Positive for shortness breath with exertion Gastrointestinal: Negative for abdominal pain Genitourinary: Negative for dysuria. Musculoskeletal: Negative for leg pain or swelling. Neurological: Negative for headache All other ROS negative  ____________________________________________   PHYSICAL EXAM:  VITAL SIGNS: ED Triage Vitals  Enc Vitals Group     BP 10/28/16 1019 (!) 151/87     Pulse Rate 10/28/16 1017 (!) 169     Resp 10/28/16 1017 17     Temp --      Temp Source 10/28/16 1017 Oral     SpO2 10/28/16 1017 100 %     Weight 10/28/16 1019 296 lb (134.3 kg)     Height 10/28/16 1019 5\' 11"  (1.803 m)     Head Circumference --      Peak Flow --      Pain Score 10/28/16 1017 0     Pain Loc --      Pain Edu? --      Excl. in [place]? --     Constitutional: Alert and oriented. Well appearing and in no distress. Eyes: Normal exam ENT   Head: Normocephalic and atraumatic.   Mouth/Throat: Mucous membranes are moist. Cardiovascular: Irregular rhythm rate around 150 bpm. Respiratory: Normal respiratory effort without tachypnea nor retractions. Breath sounds are  clear Gastrointestinal: Soft and nontender. No distention.  Musculoskeletal: Nontender with normal range of motion in all extremities. No significant lower extremity edema. Patient does have a small area on his left great toe could be slight cellulitis. Neurologic:  Normal speech and language. No gross focal neurologic deficits  Skin:  Skin is warm, dry and intact.  Psychiatric: Mood and affect are normal.   ____________________________________________    EKG  EKG reviewed and interpreted by myself shows atrial fibrillation with rapid ventricular response at 169 bpm, narrow QRS, normal axis, normal intervals, nonspecific ST changes with inferolateral ST depressions most likely demand ischemia related. No ST elevations.  ____________________________________________   INITIAL IMPRESSION / ASSESSMENT AND PLAN / ED COURSE  Pertinent labs & imaging results that were available during my care of the patient were reviewed by me and considered in my medical decision making (see chart for  details).  Patient presented to the emergency department data for atrial fibrillation with rapid ventricular response. Patient was found to be in A. fib with RVR during his last admission over the past few days had converted back to normal sinus rhythm now is back in atrial fibrillation at 150 bpm. Patient received 2 doses of IV metoprolol, heart rate remains in the 130s we will start on a diltiazem drip. Patient agreeable to plan. We'll admit to the hospital further treatment.   CRITICAL CARE Performed by: Harvest Dark   Total critical care time: 30 minutes  Critical care time was exclusive of separately billable procedures and treating other patients.  Critical care was necessary to treat or prevent imminent or life-threatening deterioration.  Critical care was time spent personally by me on the following activities: development of treatment plan with patient and/or surrogate as well as nursing,  discussions with consultants, evaluation of patient's response to treatment, examination of patient, obtaining history from patient or surrogate, ordering and performing treatments and interventions, ordering and review of laboratory studies, ordering and review of radiographic studies, pulse oximetry and re-evaluation of patient's condition.   ____________________________________________   FINAL CLINICAL IMPRESSION(S) / ED DIAGNOSES  Atrial fibrillation with rapid ventricular response    Harvest Dark, MD 10/28/16 1140

## 2016-10-28 NOTE — ED Notes (Signed)
Call light answered. Pt given urinal and helped to stand next to bed. Pt returned back to bed.

## 2016-10-28 NOTE — H&P (Signed)
[facility] [place] NAME: Ronnie Cunningham    MR#:  [id number]  DATE OF BIRTH:  11/10/1950  DATE OF ADMISSION:  10/28/2016  PRIMARY CARE PHYSICIAN: Tracie Harrier, MD   REQUESTING/REFERRING PHYSICIAN: Harvest Dark, MD  CHIEF COMPLAINT:  FAST PULSE AND FEELING FUNNY  HISTORY OF PRESENT ILLNESS:  Ronnie Cunningham  is a 66 y.o. male with a known history of trial fibrillation, diverticulitis, hypertension, morbid obesity, obstructive sleep apnea and multiple other medical problems was just discharged from the hospital yesterday  with a discharge diagnosis of acute cystitis atrial fibrillation with RVR , patient was discharged with by mouth antibiotics Keflex. Patient was doing fine until this a.m.,but today he started feeling funny and his pulse was fast. During previous admission patient was seen by Dr. Nehemiah Massed for atrial fibrillation with RVR. Patient denies any dizziness or chest pain during my examination but reporting palpitations. Patient's heart rate did not improve with beta blocker boluses and eventually he was started on IV Cardizem drip and hospitalist team is called to admit the patient. PAST MEDICAL HISTORY:   Past Medical History:  Diagnosis Date  . Asthma   . BPH (benign prostatic hyperplasia)   . Diabetes mellitus without complication ([place])   . Hypertension   . Morbid obesity ([place])   . Seasonal allergies   . Sleep apnea   . Spasmodic dysphonia     PAST SURGICAL HISTOIRY:   Past Surgical History:  Procedure Laterality Date  . ADENOIDECTOMY    . CARDIAC SURGERY    . TONSILLECTOMY      SOCIAL HISTORY:   Social History  Substance Use Topics  . Smoking status: Former Research scientist (life sciences)  . Smokeless tobacco: Never Used  . Alcohol use 0.0 oz/week    FAMILY HISTORY:   Family History  Problem Relation Age of Onset  . Cancer Mother   . Hypertension Mother   . Cancer Father   . Hypertension Father     DRUG  ALLERGIES:   Allergies  Allergen Reactions  . Pollen Extract Other (See Comments)  . Tape Other (See Comments)    REVIEW OF SYSTEMS:  CONSTITUTIONAL: No fever, fatigue or weakness. Feeling funny EYES: No blurred or double vision.  EARS, NOSE, AND THROAT: No tinnitus or ear pain.  RESPIRATORY: No cough, shortness of breath, wheezing or hemoptysis.  CARDIOVASCULAR: reporting palpitations.No chest pain, orthopnea, edema.  GASTROINTESTINAL: No nausea, vomiting, diarrhea or abdominal pain.  GENITOURINARY: No dysuria, hematuria.  ENDOCRINE: No polyuria, nocturia,  HEMATOLOGY: No anemia, easy bruising or bleeding SKIN: No rash or lesion. MUSCULOSKELETAL: No joint pain or arthritis.   NEUROLOGIC: No tingling, numbness, weakness.  PSYCHIATRY: No anxiety or depression.   MEDICATIONS AT HOME:   Prior to Admission medications   Medication Sig Start Date End Date Taking? Authorizing Provider  albuterol (PROAIR HFA) 108 (90 Base) MCG/ACT inhaler Inhale into the lungs. 11/12/11  Yes [provider]  ALPRAZolam (XANAX) 0.25 MG tablet Take 0.25 mg by mouth daily as needed.    Yes [provider]  amLODipine (NORVASC) 5 MG tablet Take 5 mg by mouth daily. 10/28/16  Yes [provider]  aspirin 325 MG tablet Take 325 mg by mouth daily.    Yes [provider]  atorvastatin (LIPITOR) 20 MG tablet Take 20 mg by mouth at bedtime.  04/01/12  Yes [provider]  bisoprolol-hydrochlorothiazide (ZIAC) 2.5-6.25 MG tablet Take 1 tablet by mouth daily.    Yes  [provider]  cephALEXin (KEFLEX) 500 MG capsule Take 1 capsule (500 mg total) by mouth 3 (three) times daily. 10/27/16 11/03/16 Yes Fritzi Mandes, MD  Coenzyme Q10 (CO Q 10) 100 MG CAPS Take 2 capsules by mouth daily.    Yes [provider]  escitalopram (LEXAPRO) 20 MG tablet Take 20 mg by mouth daily.  04/05/14  Yes [provider]  finasteride (PROSCAR) 5 MG tablet Take 5 mg by mouth  daily.  12/08/14  Yes [provider]  fluticasone (FLONASE) 50 MCG/ACT nasal spray Place 2 sprays into both nostrils daily.  12/08/14  Yes [provider]  loratadine-pseudoephedrine (CLARITIN-D 24-HOUR) 10-240 MG 24 hr tablet Take 1 tablet by mouth daily.  04/12/15  Yes [provider]  metFORMIN (GLUCOPHAGE) 500 MG tablet Take 500 mg by mouth 2 (two) times daily with a meal.    Yes [provider]  montelukast (SINGULAIR) 10 MG tablet Take 10 mg by mouth daily as needed.  05/30/15  Yes [provider]  Multiple Vitamin (MULTIVITAMIN WITH MINERALS) TABS tablet Take 1 tablet by mouth daily.   Yes [provider]  pantoprazole (PROTONIX) 40 MG tablet Take 40 mg by mouth daily.  04/13/15  Yes [provider]  tamsulosin (FLOMAX) 0.4 MG CAPS capsule Take 0.4 mg by mouth daily after supper.  05/19/12  Yes [provider]  traZODone (DESYREL) 50 MG tablet Take 50 mg by mouth at bedtime.   Yes [provider]      VITAL SIGNS:  Blood pressure (!) 150/133, pulse (!) 157, resp. rate 18, height 5\' 11"  (1.803 m), weight 134.3 kg (296 lb), SpO2 100 %.  PHYSICAL EXAMINATION:  GENERAL:  66 y.o.-year-old patient lying in the bed with no acute distress. obese EYES: Pupils equal, round, reactive to light and accommodation. No scleral icterus. Extraocular muscles intact.  HEENT: Head atraumatic, normocephalic. Oropharynx and nasopharynx clear.  NECK:  Supple, no jugular venous distention. No thyroid enlargement, no tenderness.  LUNGS: Normal breath sounds bilaterally, no wheezing, rales,rhonchi or crepitation. No use of accessory muscles of respiration.  CARDIOVASCULAR:irregularly irregular. No murmurs, rubs, or gallops.  ABDOMEN: Soft, nontender, nondistended. Bowel sounds present.  EXTREMITIES: No pedal edema, cyanosis, or clubbing.  NEUROLOGIC: Cranial nerves II through XII are intact. Muscle strength 5/5 in all extremities.  Sensation intact. Gait not checked.  PSYCHIATRIC: The patient is alert and oriented x 3.  SKIN: No obvious rash, lesion, or ulcer.   LABORATORY PANEL:   CBC  Recent Labs Lab 10/28/16 1010  WBC 7.7  HGB 14.7  HCT 43.4  PLT 192   ------------------------------------------------------------------------------------------------------------------  Chemistries   Recent Labs Lab 10/26/16 1130  10/28/16 1010  NA 142  < > 141  K 3.6  < > 3.3*  CL 106  < > 107  CO2 28  < > 25  GLUCOSE 160*  < > 170*  BUN 14  < > 12  CREATININE 0.98  < > 0.95  CALCIUM 9.4  < > 8.7*  AST 19  --   --   ALT 22  --   --   ALKPHOS 56  --   --   BILITOT 0.5  --   --   < > = values in this interval not displayed. ------------------------------------------------------------------------------------------------------------------  Cardiac Enzymes  Recent Labs Lab 10/28/16 1010  TROPONINI 0.03*   ------------------------------------------------------------------------------------------------------------------  RADIOLOGY:  Dg Chest 2 View  Result Date: 10/28/2016 CLINICAL DATA:  Tachycardia. EXAM: CHEST  2 VIEW COMPARISON:  Radiographs of October 26, 2016. FINDINGS: The heart size and mediastinal contours are within normal limits. Both lungs are clear. No pneumothorax or pleural effusion is noted. The visualized skeletal structures are unremarkable. IMPRESSION: No active cardiopulmonary disease. Electronically Signed   By: Marijo Conception, M.D.   On: 10/28/2016 12:47   US Renal  Result Date: 10/26/2016 CLINICAL DATA:  Urinary tract infection EXAM: RENAL / URINARY TRACT ULTRASOUND COMPLETE COMPARISON:  CT 10/02/2012 FINDINGS: Right Kidney: Length: 12.1 seen. Echogenicity within normal limits. No mass or hydronephrosis visualized. Left Kidney: Length: 12.8 cm. Echogenicity within normal limits. There is an approximately 1.6 x 1.4 x 1.7 cm circumscribed hypoechoic cyst in the lower pole the left kidney  similar to that noted in 2014 consistent with a benign finding. Bladder: Appears normal for degree of bladder distention. No calculus, bladder mass or significant wall thickening. Other: The adjacent liver is slightly echogenic in appearance consistent fatty infiltration. IMPRESSION: 1. Fatty appearing liver. 2. Stable appearing left lower pole renal cyst measuring 1.6 x 1.4 x 1.7 cm. 3. No renal calculus nor obstruction. 4. No significant renal cortical thinning. Electronically Signed   By: Ashley Royalty M.D.   On: 10/26/2016 17:27    EKG:   Orders placed or performed during the hospital encounter of 10/28/16  . EKG 12-Lead  . EKG 12-Lead  . ED EKG within 10 minutes  . ED EKG within 10 minutes    IMPRESSION AND PLAN:   Ronnie Cunningham  is a 66 y.o. male with a known history of trial fibrillation, diverticulitis, hypertension, morbid obesity, obstructive sleep apnea and multiple other medical problems was just discharged from the hospital yesterday  with a discharge diagnosis of acute cystitis atrial fibrillation with RVR , patient was discharged with by mouth antibiotics Keflex. Patient was doing fine until this a.m.,but today he started feeling funny and his pulse was fast. During previous admission patient was seen by Dr. Nehemiah Massed for atrial fibrillation with RVR.    #atrial fib relation with RVR Admit to telemetry Patient is started on Cardizem drip Provide IV fluids Consult cardiology- [place]  ASA for now and leaving to Cardiology regarding NOAC Check TSH  #Recent UTI Continue home medication Keflex for 7 more days  #obstructive sleep apnea continue CPAP  #Hypokalemia replete and recheck  # diabetes mellitus Hold metformin and provide sliding scale insulin  #hypertensionContinue home medication amlodipine   GI and DVT prophylaxis  All the records are reviewed and case discussed with ED provider. Management plans discussed with the patient, family and they are in  agreement.  CODE STATUS: FC, wife HCPOA  TOTAL CRITICAL CARE TIME TAKING CARE OF THIS PATIENT: 45  minutes.   Note: This dictation was prepared with Dragon dictation along with smaller phrase technology. Any transcriptional errors that result from this process are unintentional.  Nicholes Mango M.D on 10/28/2016 at 3:22 PM  Between 7am to 6pm - Pager - [phone]  After 6pm go to [email or web address] - password [email or web address] [facility]  Office  [phone]  CC: Primary care physician; Tracie Harrier, MD

## 2016-10-28 NOTE — Consult Note (Signed)
[facility]  CARDIOLOGY CONSULT NOTE  Patient ID: Ronnie Cunningham MRN: [id number] DOB/AGE: [birth year] 66 y.o.  Admit date: 10/28/2016 Referring Physician [facility] Primary Physician [facility] Primary Cardiologist  Reason for Consultation Atrial fibrillation  HPI: 66 year old gentleman referred for evaluation of atrial fibrillation. Patient was admitted on 10/26/2016 with 2-3 day history of fever, diagnosed with urinary tract infection, noted to be in atrial fibrillation with a rapid ventricular rate. The patient was admitted, spontaneous converted to sinus rhythm, discharge on 8/26 2018. The patient escorted to his wife for elective colonoscopy, was noted to be tachycardic and was readmitted earlier today, with recurrent atrial fibrillation with rapid ventricular rate. Patient was treated with Cardizem drip, has converted to sinus rhythm. The patient has a history of hypertension, type 2 diabetes, and sleep apnea, with chads Vasc of 3.  Review of systems complete and found to be negative unless listed above     Past Medical History:  Diagnosis Date  . Asthma   . BPH (benign prostatic hyperplasia)   . Diabetes mellitus without complication ([place])   . Hypertension   . Morbid obesity ([place])   . Seasonal allergies   . Sleep apnea   . Spasmodic dysphonia     Past Surgical History:  Procedure Laterality Date  . ADENOIDECTOMY    . CARDIAC SURGERY    . TONSILLECTOMY      Prescriptions Prior to Admission  Medication Sig Dispense Refill Last Dose  . albuterol (PROAIR HFA) 108 (90 Base) MCG/ACT inhaler Inhale into the lungs.   PRN at PRN  . ALPRAZolam (XANAX) 0.25 MG tablet Take 0.25 mg by mouth daily as needed.    PRN at PRN  . amLODipine (NORVASC) 5 MG tablet Take 5 mg by mouth daily.   10/27/2016 at 0930  . aspirin 325 MG tablet Take 325 mg by mouth daily.    10/27/2016 at 0930  . atorvastatin (LIPITOR) 20 MG tablet Take 20 mg by mouth at bedtime.    Past Week at Unknown time  .  bisoprolol-hydrochlorothiazide (ZIAC) 2.5-6.25 MG tablet Take 1 tablet by mouth daily.    Past Week at Unknown time  . cephALEXin (KEFLEX) 500 MG capsule Take 1 capsule (500 mg total) by mouth 3 (three) times daily. 21 capsule 0 10/27/2016 at Unknown time  . Coenzyme Q10 (CO Q 10) 100 MG CAPS Take 2 capsules by mouth daily.    Past Week at Unknown time  . escitalopram (LEXAPRO) 20 MG tablet Take 20 mg by mouth daily.    10/27/2016 at 0930  . finasteride (PROSCAR) 5 MG tablet Take 5 mg by mouth daily.    10/27/2016 at 0930  . fluticasone (FLONASE) 50 MCG/ACT nasal spray Place 2 sprays into both nostrils daily.    10/27/2016 at 0830  . loratadine-pseudoephedrine (CLARITIN-D 24-HOUR) 10-240 MG 24 hr tablet Take 1 tablet by mouth daily.    PRN at PRN  . metFORMIN (GLUCOPHAGE) 500 MG tablet Take 500 mg by mouth 2 (two) times daily with a meal.    10/27/2016 at 0830  . montelukast (SINGULAIR) 10 MG tablet Take 10 mg by mouth daily as needed.    10/19/2016 at 2000  . Multiple Vitamin (MULTIVITAMIN WITH MINERALS) TABS tablet Take 1 tablet by mouth daily.   10/27/2016 at 0830  . pantoprazole (PROTONIX) 40 MG tablet Take 40 mg by mouth daily.    10/27/2016 at Unknown time  . tamsulosin (FLOMAX) 0.4 MG CAPS capsule Take 0.4 mg by mouth daily  after supper.    10/26/2016 at 1750  . traZODone (DESYREL) 50 MG tablet Take 50 mg by mouth at bedtime.   10/26/2016 at 2100   Social History   Social History  . Marital status: Married    Spouse name: N/A  . Number of children: N/A  . Years of education: N/A   Occupational History  . Not on file.   Social History Main Topics  . Smoking status: Former Research scientist (life sciences)  . Smokeless tobacco: Never Used  . Alcohol use 0.0 oz/week  . Drug use: No  . Sexual activity: Not on file   Other Topics Concern  . Not on file   Social History Narrative  . No narrative on file    Family History  Problem Relation Age of Onset  . Cancer Mother   . Hypertension Mother   . Cancer Father    . Hypertension Father       Review of systems complete and found to be negative unless listed above      PHYSICAL EXAM  General: Well developed, well nourished, in no acute distress HEENT:  Normocephalic and atramatic Neck:  No JVD.  Lungs: Clear bilaterally to auscultation and percussion. Heart: HRRR . Normal S1 and S2 without gallops or murmurs.  Abdomen: Bowel sounds are positive, abdomen soft and non-tender  Msk:  Back normal, normal gait. Normal strength and tone for age. Extremities: No clubbing, cyanosis or edema.   Neuro: Alert and oriented X 3. Psych:  Good affect, responds appropriately  Labs:   Lab Results  Component Value Date   WBC 7.7 10/28/2016   HGB 14.7 10/28/2016   HCT 43.4 10/28/2016   MCV 87.7 10/28/2016   PLT 192 10/28/2016    Recent Labs Lab 10/26/16 1130  10/28/16 1010  NA 142  < > 141  K 3.6  < > 3.3*  CL 106  < > 107  CO2 28  < > 25  BUN 14  < > 12  CREATININE 0.98  < > 0.95  CALCIUM 9.4  < > 8.7*  PROT 6.7  --   --   BILITOT 0.5  --   --   ALKPHOS 56  --   --   ALT 22  --   --   AST 19  --   --   GLUCOSE 160*  < > 170*  < > = values in this interval not displayed. Lab Results  Component Value Date   TROPONINI 0.03 ([place]) 10/28/2016   No results found for: CHOL No results found for: HDL No results found for: LDLCALC No results found for: TRIG No results found for: CHOLHDL No results found for: LDLDIRECT    Radiology: Dg Chest 2 View  Result Date: 10/28/2016 CLINICAL DATA:  Tachycardia. EXAM: CHEST  2 VIEW COMPARISON:  Radiographs of October 26, 2016. FINDINGS: The heart size and mediastinal contours are within normal limits. Both lungs are clear. No pneumothorax or pleural effusion is noted. The visualized skeletal structures are unremarkable. IMPRESSION: No active cardiopulmonary disease. Electronically Signed   By: Marijo Conception, M.D.   On: 10/28/2016 12:47   Dg Chest 2 View  Result Date: 10/26/2016 CLINICAL DATA:  Chills,  body aches, and flu-like illness for 3 days. Atrial fibrillation. EXAM: CHEST  2 VIEW COMPARISON:  03/11/2013 FINDINGS: The heart size and mediastinal contours are within normal limits. Both lungs are clear. The visualized skeletal structures are unremarkable. IMPRESSION: No active cardiopulmonary disease. Electronically Signed  By: Earle Gell M.D.   On: 10/26/2016 12:36   US Renal  Result Date: 10/26/2016 CLINICAL DATA:  Urinary tract infection EXAM: RENAL / URINARY TRACT ULTRASOUND COMPLETE COMPARISON:  CT 10/02/2012 FINDINGS: Right Kidney: Length: 12.1 seen. Echogenicity within normal limits. No mass or hydronephrosis visualized. Left Kidney: Length: 12.8 cm. Echogenicity within normal limits. There is an approximately 1.6 x 1.4 x 1.7 cm circumscribed hypoechoic cyst in the lower pole the left kidney similar to that noted in 2014 consistent with a benign finding. Bladder: Appears normal for degree of bladder distention. No calculus, bladder mass or significant wall thickening. Other: The adjacent liver is slightly echogenic in appearance consistent fatty infiltration. IMPRESSION: 1. Fatty appearing liver. 2. Stable appearing left lower pole renal cyst measuring 1.6 x 1.4 x 1.7 cm. 3. No renal calculus nor obstruction. 4. No significant renal cortical thinning. Electronically Signed   By: Ashley Royalty M.D.   On: 10/26/2016 17:27    EKG: Atrial fibrillation with a rapid ventricular rate  ASSESSMENT AND PLAN:   1. Atrial fibrillation with a rapid ventricular rate, in the setting of urinary tract infection, chads Vasc 3, converted to sinus rhythm on Cardizem drip  Recommendations  1. DC amlodipine 2. Start Cardizem CD 240 mg daily for better AV nodal blockade 3. Taper Cardizem drip to off 4. DC bisoprolol 5. Start metoprolol tartrate 25 mg twice a day 6. Continue aspirin for now for stroke prevention 7. Discuss chronic anticoagulation as outpatient since atrial fibrillation likely precipitated  during urinary tract infection which should be considered as as a reversible cause. We will evaluate as an outpatient for recurrent atrial fibrillation to definitively determine whether the patient is a candidate for long-term chronic anticoagulation. 8. 2-D echocardiogram  Signed: Isaias Cowman MD,PhD, [facility] 10/28/2016, 5:58 PM

## 2016-10-29 ENCOUNTER — Observation Stay
Admit: 2016-10-29 | Discharge: 2016-10-29 | Disposition: A | Discharge status: Home / Self Care | Payer: Medicare HMO | Attending: Cardiology | Admitting: Cardiology

## 2016-10-29 LAB — BASIC METABOLIC PANEL
ANION GAP: 8 (ref 5–15)
BUN: 14 mg/dL (ref 6–20)
CHLORIDE: 111 mmol/L (ref 101–111)
CO2: 24 mmol/L (ref 22–32)
Calcium: 8.3 mg/dL — ABNORMAL LOW (ref 8.9–10.3)
Creatinine, Ser: 0.84 mg/dL (ref 0.61–1.24)
GFR calc Af Amer: >60 mL/min (ref >60)
Glucose, Bld: 110 mg/dL — ABNORMAL HIGH (ref 65–99)
POTASSIUM: 3.5 mmol/L (ref 3.5–5.1)
SODIUM: 143 mmol/L (ref 135–145)

## 2016-10-29 LAB — ECHOCARDIOGRAM COMPLETE
HEIGHTINCHES: 71 in
Weight: 4736 oz

## 2016-10-29 LAB — TSH: TSH: 1.287 u[IU]/mL (ref 0.350–4.500)

## 2016-10-29 LAB — CBC
HCT: 38.5 % — ABNORMAL LOW (ref 40.0–52.0)
HEMOGLOBIN: 13.3 g/dL (ref 13.0–18.0)
MCH: 30.7 pg (ref 26.0–34.0)
MCHC: 34.5 g/dL (ref 32.0–36.0)
MCV: 88.8 fL (ref 80.0–100.0)
PLATELETS: 187 10*3/uL (ref 150–440)
RBC: 4.34 MIL/uL — AB (ref 4.40–5.90)
RDW: 13.2 % (ref 11.5–14.5)
WBC: 8.1 10*3/uL (ref 3.8–10.6)

## 2016-10-29 LAB — GLUCOSE, CAPILLARY
GLUCOSE-CAPILLARY: 123 mg/dL — AB (ref 65–99)
Glucose-Capillary: 116 mg/dL — ABNORMAL HIGH (ref 65–99)
Glucose-Capillary: 103 mg/dL — ABNORMAL HIGH (ref 65–99)

## 2016-10-29 LAB — MAGNESIUM: Magnesium: 2 mg/dL (ref 1.7–2.4)

## 2016-10-29 LAB — TROPONIN I: Troponin I: <0.03 ng/mL (ref <0.03)

## 2016-10-29 MED ORDER — METOPROLOL TARTRATE 25 MG PO TABS: 25.0000 mg | ORAL_TABLET | Freq: Two times a day (BID) | ORAL | 2 refills | Status: AC

## 2016-10-29 MED ORDER — AMOXICILLIN-POT CLAVULANATE 875-125 MG PO TABS: 1.0000 | ORAL_TABLET | Freq: Two times a day (BID) | ORAL | 0 refills | Status: DC

## 2016-10-29 MED ORDER — DILTIAZEM HCL ER COATED BEADS 240 MG PO CP24: 240.0000 mg | ORAL_CAPSULE | Freq: Every day | ORAL | 2 refills | Status: DC

## 2016-10-29 MED ORDER — CYCLOBENZAPRINE HCL 10 MG PO TABS
10.0000 mg | ORAL_TABLET | Freq: Once | ORAL | Status: AC
  Administered 2016-10-29: 10 mg via ORAL
  Filled 2016-10-29: qty 1

## 2016-10-29 MED ORDER — AMOXICILLIN-POT CLAVULANATE 875-125 MG PO TABS
1.0000 | ORAL_TABLET | Freq: Two times a day (BID) | ORAL | Status: DC
  Administered 2016-10-29: 1 via ORAL
  Filled 2016-10-29: qty 1

## 2016-10-29 MED ORDER — POTASSIUM CHLORIDE CRYS ER 20 MEQ PO TBCR
40.0000 meq | EXTENDED_RELEASE_TABLET | Freq: Once | ORAL | Status: AC
  Administered 2016-10-29: 40 meq via ORAL
  Filled 2016-10-29: qty 2

## 2016-10-29 NOTE — Progress Notes (Signed)
*  PRELIMINARY RESULTS* Echocardiogram 2D Echocardiogram has been performed.  Sherrie Sport 10/29/2016, 1:42 PM

## 2016-10-29 NOTE — Plan of Care (Signed)
Problem: Activity: Goal: Risk for activity intolerance will decrease Outcome: Progressing RN will ambulate patient prior to discharge to ensure patient's activity tolerance has increased.

## 2016-10-29 NOTE — Progress Notes (Signed)
[facility]  SUBJECTIVE: Patient denies chest pain or palpitation   Vitals:   10/28/16 1434 10/28/16 1500 10/28/16 1908 10/29/16 0412  BP: (!) 150/133 113/77 124/69 121/66  Pulse: (!) 157  62 62  Resp: 18  18 17   Temp:   98.2 F (36.8 C) 98.2 F (36.8 C)  TempSrc:   Oral Oral  SpO2: 100%  98% 97%  Weight:      Height:         Intake/Output Summary (Last 24 hours) at 10/29/16 1610 Last data filed at 10/29/16 9604  Gross per 24 hour  Intake           2432.5 ml  Output             1000 ml  Net           1432.5 ml      PHYSICAL EXAM  General: Well developed, well nourished, in no acute distress HEENT:  Normocephalic and atramatic Neck:  No JVD.  Lungs: Clear bilaterally to auscultation and percussion. Heart: HRRR . Normal S1 and S2 without gallops or murmurs.  Abdomen: Bowel sounds are positive, abdomen soft and non-tender  Msk:  Back normal, normal gait. Normal strength and tone for age. Extremities: No clubbing, cyanosis or edema.   Neuro: Alert and oriented X 3. Psych:  Good affect, responds appropriately   LABS: Basic Metabolic Panel:  Recent Labs  10/28/16 1010 10/29/16 0403  NA 141 143  K 3.3* 3.5  CL 107 111  CO2 25 24  GLUCOSE 170* 110*  BUN 12 14  CREATININE 0.95 0.84  CALCIUM 8.7* 8.3*  MG  --  2.0   Liver Function Tests:  Recent Labs  10/26/16 1130  AST 19  ALT 22  ALKPHOS 56  BILITOT 0.5  PROT 6.7  ALBUMIN 3.7   No results for input(s): LIPASE, AMYLASE in the last 72 hours. CBC:  Recent Labs  10/26/16 1130  10/28/16 1010 10/29/16 0403  WBC 19.7*  < > 7.7 8.1  NEUTROABS 15.0*  --   --   --   HGB 14.6  < > 14.7 13.3  HCT 43.2  < > 43.4 38.5*  MCV 88.4  < > 87.7 88.8  PLT 154  < > 192 187  < > = values in this interval not displayed. Cardiac Enzymes:  Recent Labs  10/26/16 1130 10/28/16 1010  TROPONINI <0.03 0.03*   BNP: Invalid input(s): POCBNP D-Dimer: No results for input(s): DDIMER in the last 72  hours. Hemoglobin A1C: No results for input(s): HGBA1C in the last 72 hours. Fasting Lipid Panel: No results for input(s): CHOL, HDL, LDLCALC, TRIG, CHOLHDL, LDLDIRECT in the last 72 hours. Thyroid Function Tests:  Recent Labs  10/29/16 0403  TSH 1.287   Anemia Panel: No results for input(s): VITAMINB12, FOLATE, FERRITIN, TIBC, IRON, RETICCTPCT in the last 72 hours.  Dg Chest 2 View  Result Date: 10/28/2016 CLINICAL DATA:  Tachycardia. EXAM: CHEST  2 VIEW COMPARISON:  Radiographs of October 26, 2016. FINDINGS: The heart size and mediastinal contours are within normal limits. Both lungs are clear. No pneumothorax or pleural effusion is noted. The visualized skeletal structures are unremarkable. IMPRESSION: No active cardiopulmonary disease. Electronically Signed   By: Marijo Conception, M.D.   On: 10/28/2016 12:47     Echo Pending  TELEMETRY: Sinus rhythm:  ASSESSMENT AND PLAN:  Active Problems:   Atrial fibrillation with RVR (HCC)    1. Paroxysmal atrial fibrillation, in  the setting of urinary tract infection, currently in sinus rhythm, appears stable on metoprolol and Cardizem, on aspirin for stroke prevention  Recommendations  1. Continue aspirin for now, may consider starting chronic anticoagulation as outpatient pending patient's clinical course 2. Continue metoprolol and Cardizem for rate and rhythm control 3. Review 2-D echocardiogram 4. May discharge later today after 2-D echocardiogram     Isaias Cowman, MD, PhD, [facility] 10/29/2016 9:18 AM

## 2016-10-29 NOTE — Care Management (Signed)
RNCM met with patient and he expresses anxiety about not understanding this new diagnosis of an irregular heart beat.  I spoke with MD and she will phone patient to see what questions he may have. He states that he agrees with discharge to home today. No other RNCM needs.

## 2016-10-29 NOTE — Plan of Care (Signed)
Problem: Pain Managment: Goal: General experience of comfort will improve Outcome: Progressing No complaints of pain this shift will continue to monitor.  Problem: Tissue Perfusion: Goal: Risk factors for ineffective tissue perfusion will decrease Outcome: Completed/Met Date Met: 10/29/16 lovenox for VTE  Problem: Bowel/Gastric: Goal: Will not experience complications related to bowel motility Outcome: Progressing BM this shift 8/27   

## 2016-10-29 NOTE — Progress Notes (Signed)
Pt ambulated around nurses station x3. HR stayed below 90 bpm. Echo is complete. I will discharge patient per MD order. I will continue to assess.

## 2016-10-29 NOTE — Care Management Obs Status (Signed)
[facility] NOTIFICATION   Patient Details  Name: Ronnie Cunningham MRN: [id number] Date of Birth: [birth year]   Medicare Observation Status Notification Given:  Yes    Marshell Garfinkel, RN 10/29/2016, 1:14 PM

## 2016-10-30 NOTE — Discharge Summary (Signed)
[facility] NAME: Ronnie Cunningham    MR#:  [id number]  DATE OF BIRTH:  1950/04/06  DATE OF ADMISSION:  10/28/2016   ADMITTING PHYSICIAN: Nicholes Mango, MD  DATE OF DISCHARGE: 10/29/2016  5:26 PM  PRIMARY CARE PHYSICIAN: Tracie Harrier, MD   ADMISSION DIAGNOSIS:   Atrial fibrillation with rapid ventricular response ([place]) [I48.91]  DISCHARGE DIAGNOSIS:   Active Problems:   Atrial fibrillation with RVR ([place])   SECONDARY DIAGNOSIS:   Past Medical History:  Diagnosis Date  . Asthma   . BPH (benign prostatic hyperplasia)   . Diabetes mellitus without complication ([place])   . Hypertension   . Morbid obesity ([place])   . Seasonal allergies   . Sleep apnea   . Spasmodic dysphonia     HOSPITAL COURSE:   66 year old male with past medical history significant for spasmodic dysphonia, sleep apnea, morbid obesity, hypertension, diabetes and benign prostatitic hypertrophy presents to hospital secondary to weakness and noted to have A. fib with RVR  #1 Recurrent paroxysmal atrial fibrillation with rapid ventricular response-recently in the heart is regular rate A. fib new onset, suspect was secondary to UTI. -No medication changes were made except that he is discharged on antibiotics. -Comes with recurrent palpitations and noted to have A. fib. Converted back to sinus rhythm immediately. -Medication changed to Cardizem and oral metoprolol. An echocardiogram with no acute findings. -Ambulated well and remained in sinus rhythm, heart rate is well controlled. Will be discharged. -Outpatient Holter recommended. Anticoagulation only with aspirin at this time.  #2 recent UTI-was discharged on Keflex. However has an infected ingrown toenail of his left toe. -Change antibiotics to Augmentin and outpatient follow-up. Her diet is never 3  #3 diabetes mellitus-continue metformin  #4 hypertension-medications being changed to Cardizem and  metoprolol  #5 BPH-on Flomax  #6.GERD -Protonix  #7 Depression and anxiety-continue medications  Stable with ambulation and will be discharged today.  DISCHARGE CONDITIONS:   Guarded  CONSULTS OBTAINED:   Treatment Team:  Isaias Cowman, MD  DRUG ALLERGIES:   Allergies  Allergen Reactions  . Pollen Extract Other (See Comments)  . Tape Other (See Comments)   DISCHARGE MEDICATIONS:   Allergies as of 10/29/2016      Reactions   Pollen Extract Other (See Comments)   Tape Other (See Comments)      Medication List    STOP taking these medications   amLODipine 5 MG tablet Commonly known as:  NORVASC   bisoprolol-hydrochlorothiazide 2.5-6.25 MG tablet Commonly known as:  ZIAC   cephALEXin 500 MG capsule Commonly known as:  KEFLEX   loratadine-pseudoephedrine 10-240 MG 24 hr tablet Commonly known as:  CLARITIN-D 24-hour     TAKE these medications   ALPRAZolam 0.25 MG tablet Commonly known as:  XANAX Take 0.25 mg by mouth daily as needed.   amoxicillin-clavulanate 875-125 MG tablet Commonly known as:  AUGMENTIN Take 1 tablet by mouth every 12 (twelve) hours. X 7 days   aspirin 325 MG tablet Take 325 mg by mouth daily.   atorvastatin 20 MG tablet Commonly known as:  LIPITOR Take 20 mg by mouth at bedtime.   Co Q 10 100 MG Caps Take 2 capsules by mouth daily.   diltiazem 240 MG 24 hr capsule Commonly known as:  CARDIZEM CD Take 1 capsule (240 mg total) by mouth daily.   escitalopram 20 MG tablet Commonly known as:  LEXAPRO Take 20 mg by mouth daily.  finasteride 5 MG tablet Commonly known as:  PROSCAR Take 5 mg by mouth daily.   fluticasone 50 MCG/ACT nasal spray Commonly known as:  FLONASE Place 2 sprays into both nostrils daily.   metFORMIN 500 MG tablet Commonly known as:  GLUCOPHAGE Take 500 mg by mouth 2 (two) times daily with a meal.   metoprolol tartrate 25 MG tablet Commonly known as:  LOPRESSOR Take 1 tablet (25 mg total)  by mouth 2 (two) times daily.   montelukast 10 MG tablet Commonly known as:  SINGULAIR Take 10 mg by mouth daily as needed.   multivitamin with minerals Tabs tablet Take 1 tablet by mouth daily.   pantoprazole 40 MG tablet Commonly known as:  PROTONIX Take 40 mg by mouth daily.   PROAIR HFA 108 (90 Base) MCG/ACT inhaler Generic drug:  albuterol Inhale into the lungs.   tamsulosin 0.4 MG Caps capsule Commonly known as:  FLOMAX Take 0.4 mg by mouth daily after supper.   traZODone 50 MG tablet Commonly known as:  DESYREL Take 50 mg by mouth at bedtime.            Discharge Care Instructions        Start     Ordered   10/30/16 0000  diltiazem (CARDIZEM CD) 240 MG 24 hr capsule  Daily     10/29/16 1151   10/29/16 0000  metoprolol tartrate (LOPRESSOR) 25 MG tablet  2 times daily     10/29/16 1151   10/29/16 0000  amoxicillin-clavulanate (AUGMENTIN) 875-125 MG tablet  Every 12 hours     10/29/16 1151   10/29/16 0000  Diet - low sodium heart healthy     10/29/16 1151   10/29/16 0000  Activity as tolerated - No restrictions     10/29/16 1151       DISCHARGE INSTRUCTIONS:   1. PCP follow-up in 1-2 weeks 2. Cardiology follow up in 1 week  DIET:   Cardiac diet  ACTIVITY:   Activity as tolerated  OXYGEN:   Home Oxygen: No.  Oxygen Delivery: room air  DISCHARGE LOCATION:   home   If you experience worsening of your admission symptoms, develop shortness of breath, life threatening emergency, suicidal or homicidal thoughts you must seek medical attention immediately by calling 911 or calling your MD immediately  if symptoms less severe.  You Must read complete instructions/literature along with all the possible adverse reactions/side effects for all the Medicines you take and that have been prescribed to you. Take any new Medicines after you have completely understood and accpet all the possible adverse reactions/side effects.   Please note  You were cared  for by a hospitalist during your hospital stay. If you have any questions about your discharge medications or the care you received while you were in the hospital after you are discharged, you can call the unit and asked to speak with the hospitalist on call if the hospitalist that took care of you is not available. Once you are discharged, your primary care physician will handle any further medical issues. Please note that NO REFILLS for any discharge medications will be authorized once you are discharged, as it is imperative that you return to your primary care physician (or establish a relationship with a primary care physician if you do not have one) for your aftercare needs so that they can reassess your need for medications and monitor your lab values.    On the day of Discharge:  VITAL SIGNS:  Blood pressure 123/72, pulse 63, temperature 98.1 F (36.7 C), temperature source Oral, resp. rate 18, height 5\' 11"  (1.803 m), weight 134.3 kg (296 lb), SpO2 97 %.  PHYSICAL EXAMINATION:    GENERAL:  66 y.o.-year-old obese patient lying in the bed with no acute distress.  EYES: Pupils equal, round, reactive to light and accommodation. No scleral icterus. Extraocular muscles intact.  HEENT: Head atraumatic, normocephalic. Oropharynx and nasopharynx clear. Has vocal cord dystonias with dysphonia NECK:  Supple, no jugular venous distention. No thyroid enlargement, no tenderness.  LUNGS: Normal breath sounds bilaterally, no wheezing, rales,rhonchi or crepitation. No use of accessory muscles of respiration. Decreased bibasilar breath sounds CARDIOVASCULAR: S1, S2 normal. No murmurs, rubs, or gallops.  ABDOMEN: Soft, non-tender, non-distended. Bowel sounds present. No organomegaly or mass.  EXTREMITIES: No pedal edema, cyanosis, or clubbing.  NEUROLOGIC: Cranial nerves II through XII are intact. Muscle strength 5/5 in all extremities. Sensation intact. Gait not checked.  PSYCHIATRIC: The patient is  alert and oriented x 3.  SKIN: No obvious rash, lesion, or ulcer.   DATA REVIEW:   CBC  Recent Labs Lab 10/29/16 0403  WBC 8.1  HGB 13.3  HCT 38.5*  PLT 187    Chemistries   Recent Labs Lab 10/26/16 1130  10/29/16 0403  NA 142  < > 143  K 3.6  < > 3.5  CL 106  < > 111  CO2 28  < > 24  GLUCOSE 160*  < > 110*  BUN 14  < > 14  CREATININE 0.98  < > 0.84  CALCIUM 9.4  < > 8.3*  MG  --   --  2.0  AST 19  --   --   ALT 22  --   --   ALKPHOS 56  --   --   BILITOT 0.5  --   --   < > = values in this interval not displayed.   Microbiology Results  Results for orders placed or performed during the hospital encounter of 10/26/16  Urine Culture     Status: Abnormal   Collection Time: 10/26/16  9:04 AM  Result Value Ref Range Status   Specimen Description URINE, CLEAN CATCH  Final   Special Requests NONE  Final   Culture >=100,000 COLONIES/mL KLEBSIELLA PNEUMONIAE (A)  Final   Report Status 10/28/2016 FINAL  Final   Organism ID, Bacteria KLEBSIELLA PNEUMONIAE (A)  Final      Susceptibility   Klebsiella pneumoniae - MIC*    AMPICILLIN >=32 RESISTANT Resistant     CEFAZOLIN <=4 SENSITIVE Sensitive     CEFTRIAXONE <=1 SENSITIVE Sensitive     CIPROFLOXACIN <=0.25 SENSITIVE Sensitive     GENTAMICIN <=1 SENSITIVE Sensitive     IMIPENEM <=0.25 SENSITIVE Sensitive     NITROFURANTOIN 128 RESISTANT Resistant     TRIMETH/SULFA <=20 SENSITIVE Sensitive     AMPICILLIN/SULBACTAM 8 SENSITIVE Sensitive     PIP/TAZO <=4 SENSITIVE Sensitive     Extended ESBL NEGATIVE Sensitive     * >=100,000 COLONIES/mL KLEBSIELLA PNEUMONIAE    RADIOLOGY:  No results found.   Management plans discussed with the patient, family and they are in agreement.  CODE STATUS:  Code Status History    Date Active Date Inactive Code Status Order ID Comments User Context   10/28/2016  2:32 PM 10/29/2016  8:32 PM Full Code [id number]  Nicholes Mango, MD Inpatient   10/26/2016  3:45 PM 10/27/2016  3:06 PM Full  Code [id number]  Dustin Flock, MD Inpatient      TOTAL TIME TAKING CARE OF THIS PATIENT: 37 minutes.    Vishal Sandlin M.D on 10/30/2016 at 2:36 PM  Between 7am to 6pm - Pager - [phone]  After 6pm go to [email or web address] - Proofreader  [facility]  Office  [phone]  CC: Primary care physician; Tracie Harrier, MD   Note: This dictation was prepared with Dragon dictation along with smaller phrase technology. Any transcriptional errors that result from this process are unintentional.

## 2016-11-08 ENCOUNTER — Telehealth: Payer: Self-pay | Admitting: Emergency Medicine

## 2016-11-11 ENCOUNTER — Encounter: Payer: Self-pay | Admitting: Urology

## 2016-11-11 ENCOUNTER — Ambulatory Visit (INDEPENDENT_AMBULATORY_CARE_PROVIDER_SITE_OTHER): Payer: Medicare HMO | Admitting: Urology

## 2016-11-11 VITALS — BP 112/63 | HR 67 | Ht 71.0 in | Wt 296.2 lb

## 2016-11-11 DIAGNOSIS — N390 Urinary tract infection, site not specified: Secondary | ICD-10-CM | POA: Diagnosis not present

## 2016-11-11 MED ORDER — SULFAMETHOXAZOLE-TRIMETHOPRIM 800-160 MG PO TABS: 1.0000 | ORAL_TABLET | Freq: Two times a day (BID) | ORAL | 0 refills | Status: DC

## 2016-11-11 NOTE — Progress Notes (Signed)
11/11/2016 5:40 PM   Ronnie Cunningham Jul 20, 1950 [id number]  Referring provider: Tracie Harrier, MD [address] [facility] [place], [place] [address]  Chief Complaint  Patient presents with  . Recurrent UTI    HPI: 66 year old male who was seen today for concern of a urinary tract infection.  The patient was experiencing symptoms approximately 1 month ago with increased frequency, weak stream, and burning. He subsequently developed some back pain. This then progressed into fevers and chills with associated weakness. The patient was subsequently seen in the emergency department and admitted to the hospital. In the hospital he was noted to have some cardiac arrhythmias. His blood cultures were negative. His urine cultures demonstrated Klebsiella. He was placed on Augmentin for 2 weeks.  The patient is now been off antibiotics for about a week and has noted cloudy urine, foul smelling urine, and return of symptoms with similar symptoms that he had in the past. He is also complaining of some testicular and perineal pain. The patient states that he is not having any issues with constipation. He denies any gross hematuria. He does have some pain with bowel movements.     PMH: Past Medical History:  Diagnosis Date  . Asthma   . BPH (benign prostatic hyperplasia)   . Diabetes mellitus without complication ([place])   . Hypertension   . Morbid obesity ([place])   . Seasonal allergies   . Sleep apnea   . Spasmodic dysphonia     Surgical History: Past Surgical History:  Procedure Laterality Date  . ADENOIDECTOMY    . CARDIAC SURGERY    . TONSILLECTOMY      Home Medications:  Allergies as of 11/11/2016      Reactions   Pollen Extract Other (See Comments)   Tape Other (See Comments)      Medication List       Accurate as of 11/11/16  5:40 PM. Always use your most recent med list.          ALPRAZolam 0.25 MG tablet Commonly known as:  XANAX Take 0.25 mg by  mouth daily as needed.   aspirin 325 MG tablet Take 325 mg by mouth daily.   atorvastatin 20 MG tablet Commonly known as:  LIPITOR Take 20 mg by mouth at bedtime.   Co Q 10 100 MG Caps Take 2 capsules by mouth daily.   diltiazem 240 MG 24 hr capsule Commonly known as:  CARDIZEM CD Take 1 capsule (240 mg total) by mouth daily.   escitalopram 20 MG tablet Commonly known as:  LEXAPRO Take 20 mg by mouth daily.   finasteride 5 MG tablet Commonly known as:  PROSCAR Take 5 mg by mouth daily.   fluticasone 50 MCG/ACT nasal spray Commonly known as:  FLONASE Place 2 sprays into both nostrils daily.   metFORMIN 500 MG tablet Commonly known as:  GLUCOPHAGE Take 500 mg by mouth 2 (two) times daily with a meal.   metoprolol tartrate 25 MG tablet Commonly known as:  LOPRESSOR Take 1 tablet (25 mg total) by mouth 2 (two) times daily.   montelukast 10 MG tablet Commonly known as:  SINGULAIR Take 10 mg by mouth daily as needed.   multivitamin with minerals Tabs tablet Take 1 tablet by mouth daily.   pantoprazole 40 MG tablet Commonly known as:  PROTONIX Take 40 mg by mouth daily.   PROAIR HFA 108 (90 Base) MCG/ACT inhaler Generic drug:  albuterol Inhale into the lungs.  sulfamethoxazole-trimethoprim 800-160 MG tablet Commonly known as:  BACTRIM DS,SEPTRA DS Take 1 tablet by mouth every 12 (twelve) hours.   tamsulosin 0.4 MG Caps capsule Commonly known as:  FLOMAX Take 0.4 mg by mouth daily after supper.   traZODone 50 MG tablet Commonly known as:  DESYREL Take 50 mg by mouth at bedtime.            Discharge Care Instructions        Start     Ordered   11/11/16 0000  Urinalysis, Complete     11/11/16 1452   11/11/16 0000  sulfamethoxazole-trimethoprim (BACTRIM DS,SEPTRA DS) 800-160 MG tablet  Every 12 hours     11/11/16 1537   11/11/16 0000  CULTURE, URINE COMPREHENSIVE     11/11/16 1550      Allergies:  Allergies  Allergen Reactions  . Pollen  Extract Other (See Comments)  . Tape Other (See Comments)    Family History: Family History  Problem Relation Age of Onset  . Cancer Mother   . Hypertension Mother   . Cancer Father   . Hypertension Father     Social History:  reports that he has quit smoking. He has never used smokeless tobacco. He reports that he drinks alcohol. He reports that he does not use drugs.  ROS: UROLOGY Frequent Urination?: No Hard to postpone urination?: No Burning/pain with urination?: Yes Get up at night to urinate?: No Leakage of urine?: No Urine stream starts and stops?: Yes Trouble starting stream?: Yes Do you have to strain to urinate?: No Blood in urine?: No Urinary tract infection?: Yes Sexually transmitted disease?: No Injury to kidneys or bladder?: No Painful intercourse?: No Weak stream?: Yes Erection problems?: Yes Penile pain?: No  Gastrointestinal Nausea?: No Vomiting?: No Indigestion/heartburn?: No Diarrhea?: No Constipation?: No  Constitutional Fever: Yes Night sweats?: No Weight loss?: No Fatigue?: Yes  Skin Skin rash/lesions?: No Itching?: No  Eyes Blurred vision?: No Double vision?: No  Ears/Nose/Throat Sore throat?: Yes Sinus problems?: Yes  Hematologic/Lymphatic Swollen glands?: No Easy bruising?: No  Cardiovascular Leg swelling?: Yes Chest pain?: No  Respiratory Cough?: No Shortness of breath?: No  Endocrine Excessive thirst?: No  Musculoskeletal Back pain?: Yes Joint pain?: No  Neurological Headaches?: Yes Dizziness?: No  Psychologic Depression?: Yes Anxiety?: Yes  Physical Exam: BP 112/63 (BP Location: Left Arm, Patient Position: Sitting, Cuff Size: Normal)   Pulse 67   Ht 5\' 11"  (1.803 m)   Wt 134.4 kg (296 lb 3.2 oz)   BMI 41.31 kg/m   Constitutional:  Alert and oriented, No acute distress. HEENT: [place] AT, moist mucus membranes.  Trachea midline, no masses. Cardiovascular: No clubbing, cyanosis, or edema. Respiratory:  Normal respiratory effort, no increased work of breathing. GI: Abdomen is soft, nontender, nondistended, no abdominal masses GU: No CVA tenderness.  DRE: Left lateral lobe was tender to palpation. His prostate is otherwise symmetric and approximately 40 g. Skin: No rashes, bruises or suspicious lesions. Lymph: No cervical or inguinal adenopathy. Neurologic: Grossly intact, no focal deficits, moving all 4 extremities. Psychiatric: Normal mood and affect.  Laboratory Data: Lab Results  Component Value Date   WBC 8.1 10/29/2016   HGB 13.3 10/29/2016   HCT 38.5 (L) 10/29/2016   MCV 88.8 10/29/2016   PLT 187 10/29/2016    Lab Results  Component Value Date   CREATININE 0.84 10/29/2016    No results found for: PSA  No results found for: TESTOSTERONE  No results found for: HGBA1C  Urinalysis    Component Value Date/Time   COLORURINE YELLOW (A) 10/26/2016 1251   APPEARANCEUR CLEAR (A) 10/26/2016 1251   LABSPEC 1.005 10/26/2016 1251   PHURINE 6.0 10/26/2016 1251   GLUCOSEU NEGATIVE 10/26/2016 1251   HGBUR SMALL (A) 10/26/2016 1251   BILIRUBINUR NEGATIVE 10/26/2016 1251   KETONESUR NEGATIVE 10/26/2016 1251   PROTEINUR NEGATIVE 10/26/2016 1251   NITRITE NEGATIVE 10/26/2016 1251   LEUKOCYTESUR LARGE (A) 10/26/2016 1251    Pertinent Imaging: None  Assessment & Plan:  I suspect the patient has some incompletely treated prostatitis. I suggested he continue with hydration as well as in the additional 2 weeks of antibiotic to help him through the bacterial prostatitis. We'll plan to follow-up in the next several weeks to ensure that his symptoms have cleared. I did send a urine for culture today. We will follow-up the results with him.  1. Recurrent UTI  - Urinalysis, Complete - CULTURE, URINE COMPREHENSIVE   No Follow-up on file.  Ardis Hughs, [facility] [address] [place], [place] [address] [phone]

## 2016-11-12 LAB — URINALYSIS, COMPLETE
Bilirubin, UA: NEGATIVE
Glucose, UA: NEGATIVE
KETONES UA: NEGATIVE
Nitrite, UA: POSITIVE — AB
PH UA: 6.5 (ref 5.0–7.5)
Protein, UA: NEGATIVE
RBC, UA: NEGATIVE
SPEC GRAV UA: 1.01 (ref 1.005–1.030)
Urobilinogen, Ur: 0.2 mg/dL (ref 0.2–1.0)

## 2016-11-12 LAB — MICROSCOPIC EXAMINATION
EPITHELIAL CELLS (NON RENAL): NONE SEEN /HPF (ref 0–10)
RBC, UA: NONE SEEN /hpf (ref 0–?)

## 2016-11-15 LAB — CULTURE, URINE COMPREHENSIVE

## 2016-11-19 ENCOUNTER — Ambulatory Visit: Payer: Self-pay | Admitting: Urology

## 2016-11-21 ENCOUNTER — Telehealth: Payer: Self-pay | Admitting: Urology

## 2016-11-21 NOTE — Telephone Encounter (Signed)
Patient came in to the office today asking for his lab results from 11-11-16 and wants to know if you can call him with them? Please check into this and contact the patient tomorrow please.   Sharyn Lull

## 2016-11-22 ENCOUNTER — Telehealth: Payer: Self-pay

## 2016-11-22 ENCOUNTER — Other Ambulatory Visit: Payer: Self-pay | Admitting: Internal Medicine

## 2016-11-22 DIAGNOSIS — R1012 Left upper quadrant pain: Secondary | ICD-10-CM

## 2016-11-22 DIAGNOSIS — Z87440 Personal history of urinary (tract) infections: Secondary | ICD-10-CM

## 2016-11-22 NOTE — Telephone Encounter (Signed)
LMOM

## 2016-11-28 ENCOUNTER — Telehealth: Payer: Self-pay

## 2016-11-28 DIAGNOSIS — N200 Calculus of kidney: Secondary | ICD-10-CM

## 2016-11-28 DIAGNOSIS — N390 Urinary tract infection, site not specified: Secondary | ICD-10-CM

## 2016-11-28 MED ORDER — CIPROFLOXACIN HCL 500 MG PO TABS: 500.0000 mg | ORAL_TABLET | Freq: Two times a day (BID) | ORAL | 0 refills | Status: DC

## 2016-11-28 NOTE — Telephone Encounter (Signed)
Pt called requesting ucx results. Dr. Pilar Jarvis ordered cipro 500mg  bid x7days. Made pt aware abx were sent to pharmacy. Pt voiced understanding.

## 2016-12-03 ENCOUNTER — Ambulatory Visit: Payer: Medicare HMO | Admitting: Urology

## 2016-12-03 ENCOUNTER — Encounter: Payer: Self-pay | Admitting: Urology

## 2016-12-03 VITALS — BP 122/74 | HR 54 | Ht 71.0 in | Wt 296.8 lb

## 2016-12-03 DIAGNOSIS — N390 Urinary tract infection, site not specified: Secondary | ICD-10-CM

## 2016-12-03 LAB — URINALYSIS, COMPLETE
Bilirubin, UA: NEGATIVE
Glucose, UA: NEGATIVE
KETONES UA: NEGATIVE
LEUKOCYTES UA: NEGATIVE
NITRITE UA: NEGATIVE
Protein, UA: NEGATIVE
RBC UA: NEGATIVE
UUROB: 0.2 mg/dL (ref 0.2–1.0)
pH, UA: 6 (ref 5.0–7.5)

## 2016-12-03 NOTE — Progress Notes (Signed)
12/03/2016 2:49 PM   Ronnie Cunningham [birth year] [id number]  Referring provider: Tracie Harrier, MD [address] [facility] [place], [place] [address]  No chief complaint on file.   HPI:  Ronnie Cunningham was seen for a urinary tract infection 11/11/2016. He had been in hospital with a Klebsiella UTI. Follow-up culture in the office also grew Klebsiella. Looking back an August 2018 renal ultrasound was normal. There was no hydronephrosis or stone. Bladder was not overly distended. I reviewed the images.  He was treated with an additional 2 weeks of antibiotics.  He has a h/o BPH and is on finasteride and tamsulosin. The left side of his prostate was tender but otherwise he had a normal DRE Sep 2018.   Today, he continues abx. His flow is better. It's still somewhat restricted. Very little dysuria. He has a CT scan ordered. He has a cyst on the left kidney and has had some left low back and left flank pain. He has a CT ordered.   UA clear today.   PMH: Past Medical History:  Diagnosis Date  . Asthma   . BPH (benign prostatic hyperplasia)   . Diabetes mellitus without complication ([place])   . Hypertension   . Morbid obesity ([place])   . Seasonal allergies   . Sleep apnea   . Spasmodic dysphonia     Surgical History: Past Surgical History:  Procedure Laterality Date  . ADENOIDECTOMY    . CARDIAC SURGERY    . TONSILLECTOMY      Home Medications:  Allergies as of 12/03/2016      Reactions   Pollen Extract Other (See Comments)   Tape Other (See Comments)      Medication List       Accurate as of 12/03/16  2:49 PM. Always use your most recent med list.          ALPRAZolam 0.25 MG tablet Commonly known as:  XANAX Take 0.25 mg by mouth daily as needed.   aspirin 325 MG tablet Take 325 mg by mouth daily.   atorvastatin 20 MG tablet Commonly known as:  LIPITOR Take 20 mg by mouth at bedtime.   ciprofloxacin 500 MG tablet Commonly known as:   CIPRO Take 1 tablet (500 mg total) by mouth every 12 (twelve) hours.   Co Q 10 100 MG Caps Take 2 capsules by mouth daily.   diltiazem 240 MG 24 hr capsule Commonly known as:  CARDIZEM CD Take 1 capsule (240 mg total) by mouth daily.   escitalopram 20 MG tablet Commonly known as:  LEXAPRO Take 20 mg by mouth daily.   finasteride 5 MG tablet Commonly known as:  PROSCAR Take 5 mg by mouth daily.   fluticasone 50 MCG/ACT nasal spray Commonly known as:  FLONASE Place 2 sprays into both nostrils daily.   metFORMIN 500 MG tablet Commonly known as:  GLUCOPHAGE Take 500 mg by mouth 2 (two) times daily with a meal.   metoprolol tartrate 25 MG tablet Commonly known as:  LOPRESSOR Take 1 tablet (25 mg total) by mouth 2 (two) times daily.   montelukast 10 MG tablet Commonly known as:  SINGULAIR Take 10 mg by mouth daily as needed.   multivitamin with minerals Tabs tablet Take 1 tablet by mouth daily.   pantoprazole 40 MG tablet Commonly known as:  PROTONIX Take 40 mg by mouth daily.   PROAIR HFA 108 (90 Base) MCG/ACT inhaler Generic drug:  albuterol Inhale into the lungs.  tamsulosin 0.4 MG Caps capsule Commonly known as:  FLOMAX Take 0.4 mg by mouth daily after supper.   traZODone 50 MG tablet Commonly known as:  DESYREL Take 50 mg by mouth at bedtime.       Allergies:  Allergies  Allergen Reactions  . Pollen Extract Other (See Comments)  . Tape Other (See Comments)    Family History: Family History  Problem Relation Age of Onset  . Cancer Mother   . Hypertension Mother   . Cancer Father   . Hypertension Father     Social History:  reports that he has quit smoking. He has never used smokeless tobacco. He reports that he drinks alcohol. He reports that he does not use drugs.  ROS: UROLOGY Frequent Urination?: No Hard to postpone urination?: No Burning/pain with urination?: Yes Get up at night to urinate?: No Leakage of urine?: No Urine stream  starts and stops?: No Trouble starting stream?: No Do you have to strain to urinate?: No Blood in urine?: No Urinary tract infection?: Yes Sexually transmitted disease?: No Injury to kidneys or bladder?: No Painful intercourse?: No Weak stream?: No Erection problems?: Yes Penile pain?: No  Gastrointestinal Nausea?: No Vomiting?: No Indigestion/heartburn?: No Diarrhea?: No Constipation?: No  Constitutional Fever: No Night sweats?: No Weight loss?: No Fatigue?: No  Skin Skin rash/lesions?: No Itching?: No  Eyes Blurred vision?: No Double vision?: No  Ears/Nose/Throat Sore throat?: Yes Sinus problems?: Yes  Hematologic/Lymphatic Swollen glands?: No Easy bruising?: No  Cardiovascular Leg swelling?: No Chest pain?: No  Respiratory Cough?: No Shortness of breath?: No  Endocrine Excessive thirst?: No  Musculoskeletal Back pain?: Yes Joint pain?: No  Neurological Headaches?: Yes Dizziness?: No  Psychologic Depression?: No Anxiety?: No  Physical Exam: BP 122/74 (BP Location: Right Arm, Patient Position: Sitting, Cuff Size: Large)   Pulse (!) 54   Ht 5\' 11"  (1.803 m)   Wt 134.6 kg (296 lb 12.8 oz)   BMI 41.40 kg/m   Constitutional:  Alert and oriented, No acute distress. HEENT: [place] AT, moist mucus membranes.  Trachea midline, no masses. Cardiovascular: No clubbing, cyanosis, or edema. Respiratory: Normal respiratory effort, no increased work of breathing. GI: Abdomen is soft, nontender, nondistended, no abdominal masses GU: No CVA tenderness. Skin: No rashes, bruises or suspicious lesions. Neurologic: Grossly intact, no focal deficits, moving all 4 extremities. Psychiatric: Normal mood and affect.  Laboratory Data: Lab Results  Component Value Date   WBC 8.1 10/29/2016   HGB 13.3 10/29/2016   HCT 38.5 (L) 10/29/2016   MCV 88.8 10/29/2016   PLT 187 10/29/2016    Lab Results  Component Value Date   CREATININE 0.84 10/29/2016    No  results found for: PSA1  No results found for: TESTOSTERONE  No results found for: HGBA1C  Urinalysis Lab Results  Component Value Date   SPECGRAV 1.010 11/11/2016   PHUR 6.5 11/11/2016   COLORU Yellow 11/11/2016   APPEARANCEUR Cloudy (A) 11/11/2016   LEUKOCYTESUR 2+ (A) 11/11/2016   PROTEINUR Negative 11/11/2016   GLUCOSEU Negative 11/11/2016   KETONESU Negative 11/11/2016   RBCU Negative 11/11/2016   BILIRUBINUR Negative 11/11/2016   UUROB 0.2 11/11/2016   NITRITE Positive (A) 11/11/2016    Lab Results  Component Value Date   LABMICR See below: 11/11/2016   WBCUA 6-10 (A) 11/11/2016   RBCUA None seen 11/11/2016   LABEPIT None seen 11/11/2016   BACTERIA Many (A) 11/11/2016    Pertinent Imaging: Renal US  No results found  for this or any previous visit. No results found for this or any previous visit. No results found for this or any previous visit. No results found for this or any previous visit. Results for orders placed during the hospital encounter of 10/26/16  US Renal   Narrative CLINICAL DATA:  Urinary tract infection  EXAM: RENAL / URINARY TRACT ULTRASOUND COMPLETE  COMPARISON:  CT 10/02/2012  FINDINGS: Right Kidney:  Length: 12.1 seen. Echogenicity within normal limits. No mass or hydronephrosis visualized.  Left Kidney:  Length: 12.8 cm. Echogenicity within normal limits. There is an approximately 1.6 x 1.4 x 1.7 cm circumscribed hypoechoic cyst in the lower pole the left kidney similar to that noted in 2014 consistent with a benign finding.  Bladder:  Appears normal for degree of bladder distention. No calculus, bladder mass or significant wall thickening.  Other: The adjacent liver is slightly echogenic in appearance consistent fatty infiltration.  IMPRESSION: 1. Fatty appearing liver. 2. Stable appearing left lower pole renal cyst measuring 1.6 x 1.4 x 1.7 cm. 3. No renal calculus nor obstruction. 4. No significant renal cortical  thinning.   Electronically Signed   By: Ashley Royalty M.D.   On: 10/26/2016 17:27    No results found for this or any previous visit. No results found for this or any previous visit. No results found for this or any previous visit.  Assessment & Plan:   1. Recurrent UTI -- Resolved. He does have a CT pending. We can take a look at it. He has h/o left renal cyst, too. Check UA in 2-3 weeks off abx.  - Urinalysis, Complete   No Follow-up on file.  Festus Aloe, [facility] [address] [place], [place] [address] [phone]

## 2016-12-11 ENCOUNTER — Ambulatory Visit
Admission: RE | Admit: 2016-12-11 | Discharge: 2016-12-11 | Disposition: A | Discharge status: Home / Self Care | Payer: Medicare HMO | Source: Ambulatory Visit | Attending: Internal Medicine | Admitting: Internal Medicine

## 2016-12-11 DIAGNOSIS — K8020 Calculus of gallbladder without cholecystitis without obstruction: Secondary | ICD-10-CM | POA: Diagnosis not present

## 2016-12-11 DIAGNOSIS — R1012 Left upper quadrant pain: Secondary | ICD-10-CM | POA: Diagnosis present

## 2016-12-11 DIAGNOSIS — Z87440 Personal history of urinary (tract) infections: Secondary | ICD-10-CM | POA: Diagnosis present

## 2016-12-11 DIAGNOSIS — K4090 Unilateral inguinal hernia, without obstruction or gangrene, not specified as recurrent: Secondary | ICD-10-CM | POA: Insufficient documentation

## 2016-12-11 DIAGNOSIS — I700 Atherosclerosis of aorta: Secondary | ICD-10-CM | POA: Diagnosis not present

## 2016-12-11 MED ORDER — IOPAMIDOL (ISOVUE-300) INJECTION 61%
100.0000 mL | Freq: Once | INTRAVENOUS | Status: AC | PRN
  Administered 2016-12-11: 125 mL via INTRAVENOUS

## 2016-12-23 ENCOUNTER — Ambulatory Visit (INDEPENDENT_AMBULATORY_CARE_PROVIDER_SITE_OTHER): Payer: Medicare HMO | Admitting: Urology

## 2016-12-23 ENCOUNTER — Encounter: Payer: Self-pay | Admitting: Urology

## 2016-12-23 VITALS — BP 133/75 | HR 62 | Ht 71.0 in | Wt 296.8 lb

## 2016-12-23 DIAGNOSIS — N400 Enlarged prostate without lower urinary tract symptoms: Secondary | ICD-10-CM

## 2016-12-23 DIAGNOSIS — N390 Urinary tract infection, site not specified: Secondary | ICD-10-CM | POA: Diagnosis not present

## 2016-12-23 LAB — URINALYSIS, COMPLETE
Bilirubin, UA: NEGATIVE
GLUCOSE, UA: NEGATIVE
KETONES UA: NEGATIVE
Leukocytes, UA: NEGATIVE
NITRITE UA: NEGATIVE
Protein, UA: NEGATIVE
RBC, UA: NEGATIVE
Specific Gravity, UA: <1.005 — ABNORMAL LOW (ref 1.005–1.030)
UUROB: 0.2 mg/dL (ref 0.2–1.0)
pH, UA: 6 (ref 5.0–7.5)

## 2016-12-23 NOTE — Progress Notes (Signed)
12/23/2016 2:44 PM   [name] [birth year] [id number]  Referring provider: Tracie Harrier, MD [address] [facility] [place], [place] [address]  Chief Complaint  Patient presents with  . Recurrent UTI    HPI:  F/u   1) UTI- seen Oct 2018 for hospitalization with a Klebsiella UTI Aug 2018. Follow-up culture in the office also grew Klebsiella. Looking back an August 2018 renal ultrasound was normal. There was no hydronephrosis or stone. Bladder was not overly distended. He was treated with an additional 2 weeks of antibiotics. F/u UA clear.   2) BPH - on finasteride and tamsulosin. The left side of his prostate was tender but otherwise he had a normal DRE Sep 2018. Prostate small on CT Oct 2018.   3) left renal cyst - he has a cyst on the left kidney and has had some left low back and left flank pain. He had a CT 12/11/2016 which revealed two small left renal cysts and a small prostate.   Today, pt seen for the above. UA clear today. I reviewed the CT images from 10/10. His LUTS improved and he has no dysuria or gross hematuria today.   PMH: Past Medical History:  Diagnosis Date  . Asthma   . BPH (benign prostatic hyperplasia)   . Diabetes mellitus without complication ([place])   . Hypertension   . Morbid obesity ([place])   . Seasonal allergies   . Sleep apnea   . Spasmodic dysphonia     Surgical History: Past Surgical History:  Procedure Laterality Date  . ADENOIDECTOMY    . CARDIAC SURGERY    . TONSILLECTOMY      Home Medications:  Allergies as of 12/23/2016      Reactions   Pollen Extract Other (See Comments)   Tape Other (See Comments)      Medication List       Accurate as of 12/23/16  2:44 PM. Always use your most recent med list.          ALPRAZolam 0.25 MG tablet Commonly known as:  XANAX Take 0.25 mg by mouth daily as needed.   aspirin 325 MG tablet Take 325 mg by mouth daily.   atorvastatin 20 MG tablet Commonly  known as:  LIPITOR Take 20 mg by mouth at bedtime.   Co Q 10 100 MG Caps Take 2 capsules by mouth daily.   diltiazem 240 MG 24 hr capsule Commonly known as:  CARDIZEM CD Take 1 capsule (240 mg total) by mouth daily.   escitalopram 20 MG tablet Commonly known as:  LEXAPRO Take 20 mg by mouth daily.   finasteride 5 MG tablet Commonly known as:  PROSCAR Take 5 mg by mouth daily.   fluticasone 50 MCG/ACT nasal spray Commonly known as:  FLONASE Place 2 sprays into both nostrils daily.   metFORMIN 500 MG tablet Commonly known as:  GLUCOPHAGE Take 500 mg by mouth 2 (two) times daily with a meal.   metoprolol tartrate 25 MG tablet Commonly known as:  LOPRESSOR Take 1 tablet (25 mg total) by mouth 2 (two) times daily.   montelukast 10 MG tablet Commonly known as:  SINGULAIR Take 10 mg by mouth daily as needed.   multivitamin with minerals Tabs tablet Take 1 tablet by mouth daily.   pantoprazole 40 MG tablet Commonly known as:  PROTONIX Take 40 mg by mouth daily.   PROAIR HFA 108 (90 Base) MCG/ACT inhaler Generic drug:  albuterol Inhale into the  lungs.   tamsulosin 0.4 MG Caps capsule Commonly known as:  FLOMAX Take 0.4 mg by mouth daily after supper.   traZODone 50 MG tablet Commonly known as:  DESYREL Take 50 mg by mouth at bedtime.       Allergies:  Allergies  Allergen Reactions  . Pollen Extract Other (See Comments)  . Tape Other (See Comments)    Family History: Family History  Problem Relation Age of Onset  . Cancer Mother   . Hypertension Mother   . Cancer Father   . Hypertension Father     Social History:  reports that he has quit smoking. He has never used smokeless tobacco. He reports that he drinks alcohol. He reports that he does not use drugs.  ROS: UROLOGY Frequent Urination?: No Hard to postpone urination?: No Burning/pain with urination?: No Get up at night to urinate?: No Leakage of urine?: No Urine stream starts and stops?:  No Trouble starting stream?: No Do you have to strain to urinate?: No Blood in urine?: No Urinary tract infection?: No Sexually transmitted disease?: No Injury to kidneys or bladder?: No Painful intercourse?: No Weak stream?: No Erection problems?: No Penile pain?: No  Gastrointestinal Nausea?: No Vomiting?: No Indigestion/heartburn?: No Diarrhea?: No Constipation?: No  Constitutional Fever: No Night sweats?: No Weight loss?: No Fatigue?: No  Skin Skin rash/lesions?: No Itching?: No  Eyes Blurred vision?: No Double vision?: No  Ears/Nose/Throat Sore throat?: Yes Sinus problems?: Yes  Hematologic/Lymphatic Swollen glands?: Yes Easy bruising?: No  Cardiovascular Leg swelling?: No Chest pain?: No  Respiratory Cough?: No Shortness of breath?: No  Endocrine Excessive thirst?: No  Musculoskeletal Back pain?: Yes Joint pain?: No  Neurological Headaches?: Yes Dizziness?: No  Psychologic Depression?: No Anxiety?: No  Physical Exam: BP 133/75 (BP Location: Left Arm, Patient Position: Sitting, Cuff Size: Large)   Pulse 62   Ht 5\' 11"  (1.803 m)   Wt 134.6 kg (296 lb 12.8 oz)   BMI 41.40 kg/m   Constitutional:  Alert and oriented, No acute distress. HEENT: [place] AT, moist mucus membranes.  Trachea midline, no masses. Cardiovascular: No clubbing, cyanosis, or edema. Respiratory: Normal respiratory effort, no increased work of breathing. GI: Abdomen is soft, nontender, nondistended, no abdominal masses GU: No CVA tenderness.  Skin: No rashes, bruises or suspicious lesions. Neurologic: Grossly intact, no focal deficits, moving all 4 extremities. Psychiatric: Normal mood and affect.  Laboratory Data: Lab Results  Component Value Date   WBC 8.1 10/29/2016   HGB 13.3 10/29/2016   HCT 38.5 (L) 10/29/2016   MCV 88.8 10/29/2016   PLT 187 10/29/2016    Lab Results  Component Value Date   CREATININE 0.84 10/29/2016    No results found for:  PSA1  No results found for: TESTOSTERONE  No results found for: HGBA1C  Urinalysis Lab Results  Component Value Date   SPECGRAV <1.005 (L) 12/03/2016   PHUR 6.0 12/03/2016   COLORU Yellow 12/03/2016   APPEARANCEUR Clear 12/03/2016   LEUKOCYTESUR Negative 12/03/2016   PROTEINUR Negative 12/03/2016   GLUCOSEU Negative 12/03/2016   KETONESU Negative 12/03/2016   RBCU Negative 12/03/2016   BILIRUBINUR Negative 12/03/2016   UUROB 0.2 12/03/2016   NITRITE Negative 12/03/2016    Lab Results  Component Value Date   LABMICR See below: 11/11/2016   WBCUA 6-10 (A) 11/11/2016   RBCUA None seen 11/11/2016   LABEPIT None seen 11/11/2016   BACTERIA Many (A) 11/11/2016    Pertinent imaging: CT  No results found  for this or any previous visit. No results found for this or any previous visit. No results found for this or any previous visit. No results found for this or any previous visit. Results for orders placed during the hospital encounter of 10/26/16  US Renal   Narrative CLINICAL DATA:  Urinary tract infection  EXAM: RENAL / URINARY TRACT ULTRASOUND COMPLETE  COMPARISON:  CT 10/02/2012  FINDINGS: Right Kidney:  Length: 12.1 seen. Echogenicity within normal limits. No mass or hydronephrosis visualized.  Left Kidney:  Length: 12.8 cm. Echogenicity within normal limits. There is an approximately 1.6 x 1.4 x 1.7 cm circumscribed hypoechoic cyst in the lower pole the left kidney similar to that noted in 2014 consistent with a benign finding.  Bladder:  Appears normal for degree of bladder distention. No calculus, bladder mass or significant wall thickening.  Other: The adjacent liver is slightly echogenic in appearance consistent fatty infiltration.  IMPRESSION: 1. Fatty appearing liver. 2. Stable appearing left lower pole renal cyst measuring 1.6 x 1.4 x 1.7 cm. 3. No renal calculus nor obstruction. 4. No significant renal cortical  thinning.   Electronically Signed   By: Ashley Royalty M.D.   On: 10/26/2016 17:27    No results found for this or any previous visit. No results found for this or any previous visit. No results found for this or any previous visit.  Assessment & Plan:    1. Recurrent UTI Resolved  2. bph - stable on combination therapy. Nl dre and PSA was low last year (0.31). He asked about PSA screening and a PSA was sent.  - Urinalysis, Complete   No Follow-up on file.  Festus Aloe, [facility] [address] [place], [place] [address] [phone]

## 2016-12-24 LAB — PSA: Prostate Specific Ag, Serum: 2.3 ng/mL (ref 0.0–4.0)

## 2016-12-27 ENCOUNTER — Other Ambulatory Visit: Payer: Self-pay | Admitting: Family Medicine

## 2016-12-27 ENCOUNTER — Telehealth: Payer: Self-pay | Admitting: Family Medicine

## 2016-12-27 DIAGNOSIS — Z125 Encounter for screening for malignant neoplasm of prostate: Secondary | ICD-10-CM

## 2016-12-27 NOTE — Telephone Encounter (Signed)
Patient notified and he will call back in a few days to schedule a Lab visit only for 3 months. He will need a PSA. The order has been entered.

## 2016-12-27 NOTE — Telephone Encounter (Signed)
-----   Message from Festus Aloe, MD sent at 12/26/2016  4:29 PM EDT ----- Notify patient his PSA is normal but about two points higher than last year. That may be because of his recent UTI. I recommend we recheck the PSA in 3 months.   ----- Message ----- From: Leia Alf, CMA Sent: 12/24/2016   2:46 PM To: Festus Aloe, MD    ----- Message ----- From: Interface, [facility] Lab Results In Sent: 12/23/2016   4:39 PM To: Rowe Robert Clinical

## 2017-03-07 NOTE — Telephone Encounter (Signed)
close

## 2017-07-22 ENCOUNTER — Other Ambulatory Visit: Payer: Self-pay | Admitting: Unknown Physician Specialty

## 2017-07-22 DIAGNOSIS — M542 Cervicalgia: Secondary | ICD-10-CM

## 2017-07-22 DIAGNOSIS — M47812 Spondylosis without myelopathy or radiculopathy, cervical region: Secondary | ICD-10-CM

## 2017-08-01 ENCOUNTER — Other Ambulatory Visit
Admission: RE | Admit: 2017-08-01 | Discharge: 2017-08-01 | Disposition: A | Discharge status: Home / Self Care | Payer: Medicare HMO | Source: Ambulatory Visit | Attending: Unknown Physician Specialty | Admitting: Unknown Physician Specialty

## 2017-08-01 ENCOUNTER — Ambulatory Visit
Admission: RE | Admit: 2017-08-01 | Discharge: 2017-08-01 | Disposition: A | Discharge status: Home / Self Care | Payer: Medicare HMO | Source: Ambulatory Visit | Attending: Unknown Physician Specialty | Admitting: Unknown Physician Specialty

## 2017-08-01 DIAGNOSIS — M50222 Other cervical disc displacement at C5-C6 level: Secondary | ICD-10-CM | POA: Diagnosis not present

## 2017-08-01 DIAGNOSIS — M4802 Spinal stenosis, cervical region: Secondary | ICD-10-CM | POA: Insufficient documentation

## 2017-08-01 DIAGNOSIS — M542 Cervicalgia: Secondary | ICD-10-CM

## 2017-08-01 DIAGNOSIS — M47812 Spondylosis without myelopathy or radiculopathy, cervical region: Secondary | ICD-10-CM | POA: Insufficient documentation

## 2017-08-01 LAB — CREATININE, SERUM: CREATININE: 1 mg/dL (ref 0.61–1.24)

## 2017-08-01 MED ORDER — GADOBENATE DIMEGLUMINE 529 MG/ML IV SOLN
20.0000 mL | Freq: Once | INTRAVENOUS | Status: AC | PRN
  Administered 2017-08-01: 20 mL via INTRAVENOUS

## 2017-08-26 ENCOUNTER — Other Ambulatory Visit: Payer: Self-pay | Admitting: Internal Medicine

## 2017-08-26 DIAGNOSIS — M542 Cervicalgia: Secondary | ICD-10-CM

## 2017-08-26 DIAGNOSIS — E119 Type 2 diabetes mellitus without complications: Secondary | ICD-10-CM

## 2017-08-26 DIAGNOSIS — I10 Essential (primary) hypertension: Secondary | ICD-10-CM

## 2017-12-22 ENCOUNTER — Ambulatory Visit: Payer: Medicare HMO

## 2018-01-05 ENCOUNTER — Other Ambulatory Visit: Payer: Self-pay

## 2018-01-05 ENCOUNTER — Ambulatory Visit: Payer: Medicare HMO | Admitting: Urology

## 2018-01-05 ENCOUNTER — Encounter: Payer: Self-pay | Admitting: Urology

## 2018-01-05 VITALS — BP 121/71 | HR 71 | Ht 71.0 in | Wt 312.4 lb

## 2018-01-05 DIAGNOSIS — Z125 Encounter for screening for malignant neoplasm of prostate: Secondary | ICD-10-CM

## 2018-01-05 DIAGNOSIS — N401 Enlarged prostate with lower urinary tract symptoms: Secondary | ICD-10-CM

## 2018-01-05 DIAGNOSIS — N529 Male erectile dysfunction, unspecified: Secondary | ICD-10-CM

## 2018-01-05 MED ORDER — SILDENAFIL CITRATE 20 MG PO TABS: 20.0000 mg | ORAL_TABLET | ORAL | 11 refills | Status: DC | PRN

## 2018-01-05 NOTE — Progress Notes (Signed)
   01/05/2018 5:57 PM   [name] 1951-01-06 [id number]  Reason for visit: Follow up BPH/LUTS, PSA surveillance, ED, history of UTI  HPI: I had the pleasure of meeting Mr. Roughton in urology clinic today for the above complaints.  He is a 67 year old male with long history of urinary symptoms, currently on maximal medical therapy with Flomax and finasteride.  He also has a history of a Klebsiella UTI.  This was previously worked up with an ultrasound that showed no hydronephrosis, and nondistended bladder.  He denies any hematuria or urinary infections over the last year.  He feels he is voiding well and has no complaints regarding his urinary symptoms today.  He gets up 0-1 times per night.  Prostate measures 31 cc on prior CT.  He also reports a long history of erectile dysfunction with difficulty getting and maintaining erection.  He has never tried any PDE 5 inhibitors for this.  He does not take nitrates.  He does have a family history of lethal prostate cancer.  PSA 12/26/2017 was 0.5, corrected for finasteride equals 1.  ROS: Please see flowsheet from today's date for complete review of systems.  Physical Exam: BP 121/71   Pulse 71   Ht 5\' 11"  (1.803 m)   Wt (!) 312 lb 6.4 oz (141.7 kg)   BMI 43.57 kg/m    Constitutional:  Alert and oriented, No acute distress. Respiratory: Normal respiratory effort, no increased work of breathing. GI: Abdomen is soft, nontender, nondistended, no abdominal masses GU: No CVA tenderness DRE: Deferred Skin: No rashes, bruises or suspicious lesions. Neurologic: Grossly intact, no focal deficits, moving all 4 extremities. Psychiatric: Normal mood and affect  Laboratory Data: PSA history 12/2017: 0.5 (corrected for finasteride 1.0) 04/2017: 1.35 (corrected 2.7) 08/2015: 0.3 (corrected 0.6) 07/2013: 1.01  Pertinent Imaging:  I have personally reviewed the CT abdomen pelvis with contrast dated 12/11/2016: Prostate measures 31 cc.   There is no hydronephrosis..  Assessment & Plan:   In summary, Mr. Gallaga is a 67 year old male with urinary symptoms very well managed on Flomax and finasteride.  Prostate measures 31 cc on recent CT scan.  He has a family history of lethal prostate cancer, and his most recent PSA is 0.5, corrected on finasteride is equal to 1.   Trial of Revatio for ED, we discussed risks and benefits at length Continue Flomax and finasteride Follow-up in 1 year for PSA, DRE, discussed ED  [name], MD  [facility] [address], [place], [place] [address] [phone]

## 2018-07-16 ENCOUNTER — Other Ambulatory Visit: Payer: Self-pay | Admitting: Neurology

## 2018-07-16 DIAGNOSIS — M5481 Occipital neuralgia: Secondary | ICD-10-CM

## 2018-07-24 ENCOUNTER — Ambulatory Visit
Admission: RE | Admit: 2018-07-24 | Discharge: 2018-07-24 | Disposition: A | Discharge status: Home / Self Care | Payer: Medicare HMO | Source: Ambulatory Visit | Attending: Neurology | Admitting: Neurology

## 2018-07-24 ENCOUNTER — Other Ambulatory Visit: Payer: Self-pay

## 2018-07-24 DIAGNOSIS — M5481 Occipital neuralgia: Secondary | ICD-10-CM | POA: Insufficient documentation

## 2019-01-07 ENCOUNTER — Ambulatory Visit: Payer: Medicare HMO | Admitting: Urology

## 2019-02-08 ENCOUNTER — Encounter: Payer: Self-pay | Admitting: Urology

## 2019-02-08 ENCOUNTER — Ambulatory Visit: Payer: Medicare HMO | Admitting: Urology

## 2019-02-08 ENCOUNTER — Other Ambulatory Visit: Payer: Self-pay

## 2019-02-08 VITALS — BP 140/80 | HR 82 | Ht 74.0 in | Wt 300.0 lb

## 2019-02-08 DIAGNOSIS — N138 Other obstructive and reflux uropathy: Secondary | ICD-10-CM

## 2019-02-08 DIAGNOSIS — Z125 Encounter for screening for malignant neoplasm of prostate: Secondary | ICD-10-CM

## 2019-02-08 DIAGNOSIS — N401 Enlarged prostate with lower urinary tract symptoms: Secondary | ICD-10-CM

## 2019-02-08 MED ORDER — FINASTERIDE 5 MG PO TABS: 5.0000 mg | ORAL_TABLET | Freq: Every day | ORAL | 12 refills | Status: DC

## 2019-02-08 MED ORDER — TAMSULOSIN HCL 0.4 MG PO CAPS: 0.4000 mg | ORAL_CAPSULE | Freq: Every day | ORAL | 12 refills | Status: DC

## 2019-02-08 NOTE — Patient Instructions (Addendum)
Benign Prostatic Hyperplasia  Benign prostatic hyperplasia (BPH) is an enlarged prostate gland that is caused by the normal aging process and not by cancer. The prostate is a walnut-sized gland that is involved in the production of semen. It is located in front of the rectum and below the bladder. The bladder stores urine and the urethra is the tube that carries the urine out of the body. The prostate may get bigger as a man gets older. An enlarged prostate can press on the urethra. This can make it harder to pass urine. The build-up of urine in the bladder can cause infection. Back pressure and infection may progress to bladder damage and kidney (renal) failure. What are the causes? This condition is part of a normal aging process. However, not all men develop problems from this condition. If the prostate enlarges away from the urethra, urine flow will not be blocked. If it enlarges toward the urethra and compresses it, there will be problems passing urine. What increases the risk? This condition is more likely to develop in men over the age of 68 years. What are the signs or symptoms? Symptoms of this condition include:  Getting up often during the night to urinate.  Needing to urinate frequently during the day.  Difficulty starting urine flow.  Decrease in size and strength of your urine stream.  Leaking (dribbling) after urinating.  Inability to pass urine. This needs immediate treatment.  Inability to completely empty your bladder.  Pain when you pass urine. This is more common if there is also an infection.  Urinary tract infection (UTI). How is this diagnosed? This condition is diagnosed based on your medical history, a physical exam, and your symptoms. Tests will also be done, such as:  A post-void bladder scan. This measures any amount of urine that may remain in your bladder after you finish urinating.  A digital rectal exam. In a rectal exam, your health care provider  checks your prostate by putting a lubricated, gloved finger into your rectum to feel the back of your prostate gland. This exam detects the size of your gland and any abnormal lumps or growths.  An exam of your urine (urinalysis).  A prostate specific antigen (PSA) screening. This is a blood test used to screen for prostate cancer.  An ultrasound. This test uses sound waves to electronically produce a picture of your prostate gland. Your health care provider may refer you to a specialist in kidney and prostate diseases (urologist). How is this treated? Once symptoms begin, your health care provider will monitor your condition (active surveillance or watchful waiting). Treatment for this condition will depend on the severity of your condition. Treatment may include:  Observation and yearly exams. This may be the only treatment needed if your condition and symptoms are mild.  Medicines to relieve your symptoms, including: ? Medicines to shrink the prostate. ? Medicines to relax the muscle of the prostate.  Surgery in severe cases. Surgery may include: ? Prostatectomy. In this procedure, the prostate tissue is removed completely through an open incision or with a laparoscope or robotics. ? Transurethral resection of the prostate (TURP). In this procedure, a tool is inserted through the opening at the tip of the penis (urethra). It is used to cut away tissue of the inner core of the prostate. The pieces are removed through the same opening of the penis. This removes the blockage. ? Transurethral incision (TUIP). In this procedure, small cuts are made in the prostate. This lessens  the prostate's pressure on the urethra. ? Transurethral microwave thermotherapy (TUMT). This procedure uses microwaves to create heat. The heat destroys and removes a small amount of prostate tissue. ? Transurethral needle ablation (TUNA). This procedure uses radio frequencies to destroy and remove a small amount of  prostate tissue. ? Interstitial laser coagulation ([place]). This procedure uses a laser to destroy and remove a small amount of prostate tissue. ? Transurethral electrovaporization (TUVP). This procedure uses electrodes to destroy and remove a small amount of prostate tissue. ? Prostatic urethral lift. This procedure inserts an implant to push the lobes of the prostate away from the urethra. Follow these instructions at home:  Take over-the-counter and prescription medicines only as told by your health care provider.  Monitor your symptoms for any changes. Contact your health care provider with any changes.  Avoid drinking large amounts of liquid before going to bed or out in public.  Avoid or reduce how much caffeine or alcohol you drink.  Give yourself time when you urinate.  Keep all follow-up visits as told by your health care provider. This is important. Contact a health care provider if:  You have unexplained back pain.  Your symptoms do not get better with treatment.  You develop side effects from the medicine you are taking.  Your urine becomes very dark or has a bad smell.  Your lower abdomen becomes distended and you have trouble passing your urine. Get help right away if:  You have a fever or chills.  You suddenly cannot urinate.  You feel lightheaded, or very dizzy, or you faint.  There are large amounts of blood or clots in the urine.  Your urinary problems become hard to manage.  You develop moderate to severe low back or flank pain. The flank is the side of your body between the ribs and the hip. These symptoms may represent a serious problem that is an emergency. Do not wait to see if the symptoms will go away. Get medical help right away. Call your local emergency services (911 in the [place] Do not drive yourself to the hospital. Summary  Benign prostatic hyperplasia (BPH) is an enlarged prostate that is caused by the normal aging process and not by  cancer.  An enlarged prostate can press on the urethra. This can make it hard to pass urine.  This condition is part of a normal aging process and is more likely to develop in men over the age of 68 years.  Get help right away if you suddenly cannot urinate. This information is not intended to replace advice given to you by your health care provider. Make sure you discuss any questions you have with your health care provider. Document Released: 02/18/2005 Document Revised: 01/13/2018 Document Reviewed: 03/25/2016 Elsevier Patient Education  202 Prostate Cancer Screening  The prostate is a walnut-sized gland that is located below the bladder and in front of the rectum in males. The function of the prostate (prostate gland) is to add fluid to semen during ejaculation. Prostate cancer is the second most common type of cancer in men. A screening test for cancer is a test that is done before cancer symptoms start. Screening can help to identify cancer at an early stage, when the cancer can be treated more easily. The recommended prostate cancer screening test is a blood test called the prostate-specific antigen (PSA) test. PSA is a protein that is made in the prostate. As you age, your prostate naturally produces more PSA. Abnormally high PSA  levels may be caused by:  Prostate cancer.  An enlarged prostate that is not caused by cancer (benign prostatic hyperplasia, BPH). This condition is very common in older men.  A prostate gland infection (prostatitis).  Medicines to assist with hair growth, such as finasteride. Depending on the PSA results, you may need more tests, such as:  A physical exam to check the size of your prostate gland.  Blood and imaging tests.  A procedure to remove tissue samples from your prostate gland for testing (biopsy). Who should have screening? Screening recommendations vary based on age.  If you are younger than age 40, screening is not recommended.  If you  are age 66-54 and you have no risk factors, screening is not recommended.  If you are younger than age 41, ask your health care provider if you need screening if you have one of these risk factors: ? Being of African-American descent. ? Having a family history of prostate cancer.  If you are age 8-69, talk with your health care provider about your need for screening and how often screening should be done.  If you are older than age 40, screening is not recommended. This is because the risks that screening can cause are greater than the benefits that it may provide (risks outweigh the benefits). If you are at high risk for prostate cancer, your health care provider may recommend that you have screenings more often or start screening at a younger age. You may be at high risk if you:  Are older than age 17.  Are African-American.  Have a father, brother, or uncle who has been diagnosed with prostate cancer. The risk may be higher if your family member's cancer occurred at an early age. What are the benefits of screening? There is a small chance that screening may lower your risk of dying from prostate cancer. The chance is small because prostate cancer is typically a slow-growing cancer, and most men with prostate cancer die from a different cause. What are the risks of screening? The main risk of prostate cancer screening is diagnosing and treating prostate cancer that would never have caused any symptoms or problems (overdiagnosis and overtreatment). PSA screening cannot tell you if your PSA is high due to cancer or a different cause. A prostate biopsy is the only procedure to diagnose prostate cancer. Even the results of a biopsy may not tell you if your cancer needs to be treated. Slow-growing prostate cancer may not need any treatment other than monitoring, so diagnosing and treating it may cause unnecessary stress or other side effects. A prostate biopsy may also cause:  Infection or  fever.  A false negative. This is a result that shows that you do not have prostate cancer when you actually do have prostate cancer. Questions to ask your health care provider  When should I start prostate cancer screening?  What is my risk for prostate cancer?  How often do I need screening?  What type of screening tests do I need?  How do I get my test results?  What do my results mean?  Do I need treatment? Contact a health care provider if:  You have difficulty urinating.  You have pain when you urinate or ejaculate.  You have blood in your urine or semen.  You have pain in your back or in the area of your prostate.  You have trouble getting or maintaining an erection (erectile dysfunction, ED). Summary  Prostate cancer is a common type of  cancer in men. The prostate (prostate gland) is located below the bladder and in front of the rectum. This gland adds fluid to semen during ejaculation.  Prostate cancer screening may identify cancer at an early stage, when the cancer can be treated more easily.  The prostate-specific antigen (PSA) test is the recommended screening test for prostate cancer.  Discuss the risks and benefits of prostate cancer screening with your health care provider. If you are age 80 or older, screening is likely to lead to more risks than benefits (risks outweigh the benefits). This information is not intended to replace advice given to you by your health care provider. Make sure you discuss any questions you have with your health care provider. Document Released: 11/29/2016 Document Revised: 01/31/2017 Document Reviewed: 11/29/2016 Elsevier Patient Education  2020 [employer] .

## 2019-02-08 NOTE — Progress Notes (Signed)
   02/08/2019 3:45 PM   [name] 10-14-1950 [id number]  Reason for visit: Follow up BPH/LUTS, PSA screening, ED, history of UTI  HPI: I saw Mr. Teta back in urology clinic for the above issues.  Briefly, he is a 68 -year-old male with a family history of lethal prostate cancer, and urinary symptoms well managed on Flomax and finasteride.  Prostate measures 31 cc on prior CT.  Since we saw him last year he denies any significant changes in his health.  His urinary symptoms continue to be well-controlled on Flomax and finasteride.  He will notice occasionally some weak stream, but overall is doing well.  He denies any gross hematuria or urinary tract infections.  At our last visit, we had prescribed Revatio for ED, however he has not used this medication over the last year.  In terms of PSA screening, PSA remains stably low at 0.3, corrected for finasteride 0.6.  Flomax and finasteride refilled RTC 1 year for PSA  A total of 25 minutes were spent face-to-face with the patient, greater than 50% was spent in patient education, counseling, and coordination of care regarding BPH, PSA screening, ED.  [name], [facility] [address] [place],  [address] [phone]

## 2019-02-21 ENCOUNTER — Other Ambulatory Visit: Payer: Self-pay

## 2019-02-21 ENCOUNTER — Encounter: Payer: Self-pay | Admitting: Emergency Medicine

## 2019-02-21 ENCOUNTER — Ambulatory Visit
Admission: EM | Admit: 2019-02-21 | Discharge: 2019-02-21 | Disposition: A | Discharge status: Home / Self Care | Payer: Medicare HMO | Attending: Family Medicine | Admitting: Family Medicine

## 2019-02-21 DIAGNOSIS — N390 Urinary tract infection, site not specified: Secondary | ICD-10-CM

## 2019-02-21 DIAGNOSIS — J0110 Acute frontal sinusitis, unspecified: Secondary | ICD-10-CM | POA: Diagnosis not present

## 2019-02-21 DIAGNOSIS — N3091 Cystitis, unspecified with hematuria: Secondary | ICD-10-CM | POA: Diagnosis not present

## 2019-02-21 DIAGNOSIS — Z7189 Other specified counseling: Secondary | ICD-10-CM

## 2019-02-21 DIAGNOSIS — J0100 Acute maxillary sinusitis, unspecified: Secondary | ICD-10-CM

## 2019-02-21 LAB — URINALYSIS, COMPLETE (UACMP) WITH MICROSCOPIC
Bilirubin Urine: NEGATIVE
Glucose, UA: NEGATIVE mg/dL
Nitrite: POSITIVE — AB
Protein, ur: NEGATIVE mg/dL
RBC / HPF: NONE SEEN RBC/hpf (ref 0–5)
Specific Gravity, Urine: 1.01 (ref 1.005–1.030)
Squamous Epithelial / LPF: NONE SEEN (ref 0–5)
WBC, UA: >50 WBC/hpf (ref 0–5)
pH: 6.5 (ref 5.0–8.0)

## 2019-02-21 MED ORDER — AMOXICILLIN-POT CLAVULANATE 875-125 MG PO TABS: 1.0000 | ORAL_TABLET | Freq: Two times a day (BID) | ORAL | 0 refills | Status: DC

## 2019-02-21 NOTE — Discharge Instructions (Addendum)
Take medication as prescribed. Rest. Drink plenty of fluids.   Follow up with your primary care physician this week.    Return to Urgent care as needed.  Proceed directly to emergency room for difficulty controlling fevers, increased pain or worsening concerns.

## 2019-02-21 NOTE — ED Triage Notes (Signed)
Patient c/o sinus congestion, drainage, and pressure for a week and has not improved.  Patient started Doxycycline on 02/15/19.   Patient reports sore throat earlier in the week.  Patient also reports burning when urinating.   Patient reports recent fevers.

## 2019-02-21 NOTE — ED Provider Notes (Signed)
[facility] ____________________________________________  Time seen: Approximately 3:30 PM  I have reviewed the triage vital signs and the nursing notes.   HISTORY  Chief Complaint Sinus Problem and Dysuria   HPI Ronnie Cunningham is a 68 y.o. male presenting for evaluation of sinus pressure, sinus drainage and burning with urination.  Patient reports he has a history of recurrent sinusitis, and for the last 1 week he has had pressure and postnasal drainage.  Reports he called his primary doctor on 02/15/2019 for the same complaints and was called in oral doxycycline.  States he has been taken doxycycline for the last 5 to 6 days without much change and continues with postnasal drainage and sinus pressure.  States this feels consistent with a sinus infections.  States rare cough.  Denies sore throat.  Denies known sick contacts.  Has continued to eat and drink well.  Also reports today he has noticed some burning with urination.  Reports he has a history of urinary tract infections in which she wants to make sure it is evaluated.  Denies penile or testicular pain or swelling.  Denies abdominal pain.  Some low back pain.  Also reports he has had intermittent fevers, but reports fevers have not been consistent.  Has taken intermittent Tylenol.  Continues to drink fluids well.  Tracie Harrier, MD : PCP    Past Medical History:  Diagnosis Date  . Asthma   . BPH (benign prostatic hyperplasia)   . Diabetes mellitus without complication ([place])   . Hypertension   . Morbid obesity ([place])   . Seasonal allergies   . Sleep apnea   . Spasmodic dysphonia     Patient Active Problem List   Diagnosis Date Noted  . Atrial fibrillation with RVR ([place]) 10/28/2016  . UTI (urinary tract infection) 10/26/2016  . Morbid (severe) obesity due to excess calories ([place]) 04/12/2015  . Morbid obesity ([place]) 04/12/2015  . Borderline diabetes mellitus 02/22/2015  . BP (high blood pressure)  02/16/2014  . 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 Abnormal blood sugar 09/15/2013  . Chronic nonalcoholic liver disease 0000000  . Essential (primary) hypertension 09/15/2013  . 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 H/O neoplasm 10/27/2012  . 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 Obstructive apnea 02/21/2012  . Controlled type 2 diabetes mellitus without complication ([place]) [id number]  . Type 2 diabetes mellitus ([place]) 10/03/2011  . Acute antritis 07/23/2011  . Cough 07/23/2011  . HLD (hyperlipidemia) 07/23/2011  . Difficult or painful urination 06/11/2011  . Actinic keratoses 06/01/2010    Past Surgical History:  Procedure Laterality Date  . ADENOIDECTOMY    . CARDIAC SURGERY    . TONSILLECTOMY       No current facility-administered medications for this encounter.  Current Outpatient Medications:  .  albuterol (PROAIR HFA) 108 (90 Base) MCG/ACT inhaler, Inhale into the lungs., Disp: , Rfl:  .  atorvastatin (LIPITOR) 20 MG tablet, Take 20 mg by mouth at bedtime. , Disp: , Rfl:  .  Coenzyme Q10 (CO Q 10) 100 MG CAPS, Take 2 capsules by mouth daily. , Disp: , Rfl:  .  diltiazem (CARDIZEM CD) 240 MG 24 hr capsule, Take 1 capsule (240 mg total) by mouth daily. (Patient taking differently: Take 120 mg by mouth daily. ), Disp: 60 capsule, Rfl: 2 .  escitalopram (LEXAPRO) 20 MG tablet, Take 20 mg by mouth daily. , Disp: , Rfl:  .  finasteride (PROSCAR) 5 MG tablet, Take 1 tablet (5 mg  total) by mouth daily., Disp: 30 tablet, Rfl: 12 .  fluticasone (FLONASE) 50 MCG/ACT nasal spray, Place 2 sprays into both nostrils daily. , Disp: , Rfl:  .  metFORMIN (GLUCOPHAGE) 500 MG tablet, Take 1,000 mg by mouth 2 (two) times daily with a meal. , Disp: , Rfl:  .  metoprolol tartrate (LOPRESSOR) 25 MG tablet, Take 1 tablet (25 mg total) by mouth 2 (two) times daily., Disp: 60 tablet, Rfl: 2 .  Multiple Vitamin (MULTIVITAMIN WITH MINERALS) TABS tablet, Take 1 tablet by mouth daily.,  Disp: , Rfl:  .  pantoprazole (PROTONIX) 40 MG tablet, Take 40 mg by mouth daily. , Disp: , Rfl:  .  tamsulosin (FLOMAX) 0.4 MG CAPS capsule, Take 1 capsule (0.4 mg total) by mouth daily after supper., Disp: 30 capsule, Rfl: 12 .  traZODone (DESYREL) 50 MG tablet, Take 50 mg by mouth at bedtime., Disp: , Rfl:  .  amoxicillin-clavulanate (AUGMENTIN) 875-125 MG tablet, Take 1 tablet by mouth every 12 (twelve) hours., Disp: 20 tablet, Rfl: 0 .  aspirin 325 MG tablet, Take 325 mg by mouth daily. , Disp: , Rfl:  .  montelukast (SINGULAIR) 10 MG tablet, Take 10 mg by mouth daily as needed. , Disp: , Rfl:   Allergies Pollen extract and Tape  Family History  Problem Relation Age of Onset  . Cancer Mother   . Hypertension Mother   . Cancer Father   . Hypertension Father     Social History Social History   Tobacco Use  . Smoking status: Former Research scientist (life sciences)  . Smokeless tobacco: Never Used  Substance Use Topics  . Alcohol use: Yes    Alcohol/week: 0.0 standard drinks  . Drug use: No    Review of Systems Constitutional: No fever ENT: No sore throat. As above.  Cardiovascular: Denies chest pain. Respiratory: Denies shortness of breath. Gastrointestinal: No abdominal pain.  No nausea, no vomiting.  No diarrhea.   Genitourinary: Positive for dysuria. Musculoskeletal: Negative for back pain. Skin: Negative for rash.  ____________________________________________   PHYSICAL EXAM:  VITAL SIGNS: ED Triage Vitals  Enc Vitals Group     BP 02/21/19 1317 135/62     Pulse Rate 02/21/19 1317 63     Resp 02/21/19 1317 16     Temp 02/21/19 1317 98.7 F (37.1 C)     Temp Source 02/21/19 1317 Oral     SpO2 02/21/19 1317 98 %     Weight 02/21/19 1310 295 lb (133.8 kg)     Height 02/21/19 1310 5\' 11"  (1.803 m)     Head Circumference --      Peak Flow --      Pain Score 02/21/19 1310 6     Pain Loc --      Pain Edu? --      Excl. in [place]? --     Constitutional: Alert and oriented. Well  appearing and in no acute distress. Eyes: Conjunctivae are normal.  Head: Atraumatic.Mild to moderate tenderness to palpation bilateral frontal and maxillary sinuses. No swelling. No erythema.   Ears: no erythema, normal TMs bilaterally.   Nose: nasal congestion with bilateral nasal turbinate erythema and edema.   Mouth/Throat: Mucous membranes are moist.  Oropharynx non-erythematous.No tonsillar swelling or exudate.  Neck: No stridor.  No cervical spine tenderness to palpation. Hematological/Lymphatic/Immunilogical: No cervical lymphadenopathy. Cardiovascular: Normal rate, regular rhythm. Grossly normal heart sounds.  Good peripheral circulation. Respiratory: Normal respiratory effort.  No retractions.  No wheezes, rales or rhonchi.  Good air movement.  Gastrointestinal: Soft and nontender. No CVA tenderness. Musculoskeletal: Steady gait.  Neurologic:  Normal speech and language. No gait instability. Skin:  Skin is warm, dry and intact. No rash noted. Psychiatric: Mood and affect are normal. Speech and behavior are normal.  ___________________________________________   LABS (all labs ordered are listed, but only abnormal results are displayed)  Labs Reviewed  URINALYSIS, COMPLETE (UACMP) WITH MICROSCOPIC - Abnormal; Notable for the following components:      Result Value   APPearance HAZY (*)    Hgb urine dipstick TRACE (*)    Ketones, ur TRACE (*)    Nitrite POSITIVE (*)    Leukocytes,Ua MODERATE (*)    Bacteria, UA MANY (*)    All other components within normal limits  NOVEL CORONAVIRUS, NAA (HOSP ORDER, SEND-OUT TO REF LAB; TAT 18-24 HRS)  URINE CULTURE    PROCEDURES Procedures    INITIAL IMPRESSION / ASSESSMENT AND PLAN / ED COURSE  Pertinent labs & imaging results that were available during my care of the patient were reviewed by me and considered in my medical decision making (see chart for details).  Overall well-appearing patient.  COVID-19 testing completed and  advice given.  Continued sinusitis.  Patient also with dysuria, urine positive for UTI, will culture.  Will stop doxycycline and start on oral Augmentin and await urinary culture to ensure sensitivity.  Encourage patient to follow-up close with his primary care this week.  Fluids, rest, supportive care.  Discussed very strict follow-up return parameters including procedure of emergency room for any worsening complaints.Discussed indication, risks and benefits of medications with patient.   Discussed follow up with Primary care physician this week. Discussed follow up and return parameters including no resolution or any worsening concerns. Patient verbalized understanding and agreed to plan.   ____________________________________________   FINAL CLINICAL IMPRESSION(S) / ED DIAGNOSES  Final diagnoses:  Acute frontal sinusitis, recurrence not specified  Acute maxillary sinusitis, recurrence not specified  Urinary tract infection with hematuria, site unspecified  Advice given about COVID-19 virus infection     ED Discharge Orders         Ordered    amoxicillin-clavulanate (AUGMENTIN) 875-125 MG tablet  Every 12 hours     02/21/19 1419           Note: This dictation was prepared with Dragon dictation along with smaller phrase technology. Any transcriptional errors that result from this process are unintentional.         Marylene Land, NP 02/21/19 1535

## 2019-02-22 LAB — NOVEL CORONAVIRUS, NAA (HOSP ORDER, SEND-OUT TO REF LAB; TAT 18-24 HRS): SARS-CoV-2, NAA: NOT DETECTED

## 2019-02-24 ENCOUNTER — Telehealth: Payer: Self-pay | Admitting: Urology

## 2019-02-24 ENCOUNTER — Emergency Department
Admission: EM | Admit: 2019-02-24 | Discharge: 2019-02-24 | Disposition: A | Discharge status: Home / Self Care | Payer: Medicare HMO

## 2019-02-24 ENCOUNTER — Ambulatory Visit
Admission: RE | Admit: 2019-02-24 | Discharge: 2019-02-24 | Disposition: A | Discharge status: Home / Self Care | Payer: Medicare HMO | Source: Ambulatory Visit | Attending: Urology | Admitting: Urology

## 2019-02-24 ENCOUNTER — Encounter: Payer: Self-pay | Admitting: Urology

## 2019-02-24 ENCOUNTER — Other Ambulatory Visit: Payer: Self-pay | Admitting: *Deleted

## 2019-02-24 ENCOUNTER — Ambulatory Visit: Payer: Medicare HMO | Admitting: Urology

## 2019-02-24 ENCOUNTER — Other Ambulatory Visit: Payer: Self-pay

## 2019-02-24 VITALS — BP 138/78 | HR 61 | Temp 97.2°F | Ht 71.0 in | Wt 290.0 lb

## 2019-02-24 DIAGNOSIS — N138 Other obstructive and reflux uropathy: Secondary | ICD-10-CM

## 2019-02-24 DIAGNOSIS — Z1612 Extended spectrum beta lactamase (ESBL) resistance: Secondary | ICD-10-CM | POA: Diagnosis not present

## 2019-02-24 DIAGNOSIS — A498 Other bacterial infections of unspecified site: Secondary | ICD-10-CM

## 2019-02-24 DIAGNOSIS — N390 Urinary tract infection, site not specified: Secondary | ICD-10-CM

## 2019-02-24 DIAGNOSIS — N401 Enlarged prostate with lower urinary tract symptoms: Secondary | ICD-10-CM

## 2019-02-24 LAB — URINALYSIS, COMPLETE
Bilirubin, UA: NEGATIVE
Glucose, UA: NEGATIVE
Ketones, UA: NEGATIVE
Leukocytes,UA: NEGATIVE
Nitrite, UA: NEGATIVE
Protein,UA: NEGATIVE
RBC, UA: NEGATIVE
Specific Gravity, UA: <1.005 — ABNORMAL LOW (ref 1.005–1.030)
Urobilinogen, Ur: 0.2 mg/dL (ref 0.2–1.0)
pH, UA: 6.5 (ref 5.0–7.5)

## 2019-02-24 LAB — URINE CULTURE: Culture: >=100000 — AB

## 2019-02-24 LAB — MICROSCOPIC EXAMINATION
Bacteria, UA: NONE SEEN
Epithelial Cells (non renal): NONE SEEN /hpf (ref 0–10)
RBC: NONE SEEN /hpf (ref 0–2)

## 2019-02-24 LAB — BLADDER SCAN AMB NON-IMAGING

## 2019-02-24 MED ORDER — ERTAPENEM SODIUM 1 G IJ SOLR
1.0000 g | INTRAMUSCULAR | Status: DC
Start: 2019-02-24 — End: 2019-08-25

## 2019-02-24 NOTE — Telephone Encounter (Signed)
Per Zara Council, PAC patient's urine culture is resistant to current abx and needs to come into our office to be scheduled for a IV abx therapy. Patient was contacted and scheduled to come in today

## 2019-02-24 NOTE — ED Triage Notes (Signed)
Says sent here from urologist for urine culture--resistant to po antibiotics and they want picc line put in.  While in triage [facility] called. She says she knows we cant get a picc line in here tonight, and has arranged for paitaint to come in to same day tomorrow at 830, so he is leaving.

## 2019-02-24 NOTE — Telephone Encounter (Signed)
Would you call [name] and have him schedule a follow up visit in two weeks?

## 2019-02-24 NOTE — Progress Notes (Signed)
02/24/2019 9:24 PM   Ronnie Cunningham 1950-08-18 [id number]  Referring provider: Tracie Harrier, MD [address] [facility] [place],  [place] [address]  Chief Complaint  Patient presents with  . Recurrent UTI    HPI: Ronnie Cunningham is a 68 year old male who was seen at [facility] urgent care for sinusitis and dysuria who was found to have an urine culture positive for ESBL Klebsiella oxytoca.    He presented to [facility] urgent care on February 21, 2019 with the complaints of sinus pressure, sinus drainage and dysuria.    On January 01, 2019 he presented to his primary care physician, Ronnie Cunningham, with a complaint of left-sided abdominal pain associated with fatigue, diarrhea and blood in his stool for 2 to 3 weeks.  He was treated for presumptive diverticulitis with 10 days of Cipro 500 mg twice daily and Flagyl 500 mg 3 times daily.    In the early part of December he was suffering with sinusitis, he was prescribed Biaxin 500 mg twice daily for 10 days.  His symptoms did not resolve, so on  February 15, 2019, he was prescribed doxycycline 100 mg twice daily for 10 days.  His symptoms of sinus pressure and post nasal drainage did not improve.  For the few days prior to his visit to [facility]gent care, he stated he felt so awful he just stayed in the bed as he was experiencing fevers of 102, chills, flulike symptoms, body aches, headaches, a minor pain in the groin area and burning with urination. It was then [facility].  His vital signs were stable and he was afebrile at the time of his visit.    His UA was yellow hazy, urine dip was positive for trace blood, trace ketone, nitrite positive and moderate leukocytes, microscopically was positive for greater than 50 WBCs and many bacteria.  His doxycycline was discontinued and he was placed on Augmentin awaiting urine culture results and sensitivities.  He sent our office a MyChart message last  evening explaining his symptoms and asked [place] to be on the look out for his urine culture results from the [facility] urgent care visit.  Urine culture results were finalized and were positive for ESBL Klebsiella oxytoca.  Today, he is still feeling rundown.  He is experiencing dysuria and hesitancy.  He also is still having symptoms of sinusitis.  He states he feels somewhat better.  He is afebrile vital signs are stable in our office today.    His UA in our office is negative.  His PVR is 145 mL.  KUB 02/24/2019 no urinary tract stones seen.   PMH: Past Medical History:  Diagnosis Date  . Asthma   . BPH (benign prostatic hyperplasia)   . Diabetes mellitus without complication ([place])   . Hypertension   . Morbid obesity ([place])   . Seasonal allergies   . Sleep apnea   . Spasmodic dysphonia     Surgical History: Past Surgical History:  Procedure Laterality Date  . ADENOIDECTOMY    . CARDIAC SURGERY    . TONSILLECTOMY      Home Medications:  Allergies as of 02/24/2019      Reactions   Pollen Extract Other (See Comments)   Tape Other (See Comments)      Medication List       Accurate as of February 24, 2019  9:24 PM. If you have any questions, ask your nurse or  doctor.        amoxicillin-clavulanate 875-125 MG tablet Commonly known as: AUGMENTIN Take 1 tablet by mouth every 12 (twelve) hours.   aspirin 325 MG tablet Take 325 mg by mouth daily.   atorvastatin 20 MG tablet Commonly known as: LIPITOR Take 20 mg by mouth at bedtime.   Co Q 10 100 MG Caps Take 2 capsules by mouth daily.   diltiazem 240 MG 24 hr capsule Commonly known as: CARDIZEM CD Take 1 capsule (240 mg total) by mouth daily. What changed: how much to take   escitalopram 20 MG tablet Commonly known as: LEXAPRO Take 20 mg by mouth daily.   finasteride 5 MG tablet Commonly known as: PROSCAR Take 1 tablet (5 mg total) by mouth daily.   fluticasone 50 MCG/ACT nasal spray Commonly known as:  FLONASE Place 2 sprays into both nostrils daily.   metFORMIN 500 MG tablet Commonly known as: GLUCOPHAGE Take 1,000 mg by mouth 2 (two) times daily with a meal.   metoprolol tartrate 25 MG tablet Commonly known as: LOPRESSOR Take 1 tablet (25 mg total) by mouth 2 (two) times daily.   montelukast 10 MG tablet Commonly known as: SINGULAIR Take 10 mg by mouth daily as needed.   multivitamin with minerals Tabs tablet Take 1 tablet by mouth daily.   pantoprazole 40 MG tablet Commonly known as: PROTONIX Take 40 mg by mouth daily.   ProAir HFA 108 (90 Base) MCG/ACT inhaler Generic drug: albuterol Inhale into the lungs.   tamsulosin 0.4 MG Caps capsule Commonly known as: FLOMAX Take 1 capsule (0.4 mg total) by mouth daily after supper.   traZODone 50 MG tablet Commonly known as: DESYREL Take 50 mg by mouth at bedtime.       Allergies:  Allergies  Allergen Reactions  . Pollen Extract Other (See Comments)  . Tape Other (See Comments)    Family History: Family History  Problem Relation Age of Onset  . Cancer Mother   . Hypertension Mother   . Cancer Father   . Hypertension Father     Social History:  reports that he has quit smoking. He has never used smokeless tobacco. He reports current alcohol use. He reports that he does not use drugs.  ROS: UROLOGY Frequent Urination?: No Hard to postpone urination?: No Burning/pain with urination?: Yes Get up at night to urinate?: No Leakage of urine?: No Urine stream starts and stops?: No Trouble starting stream?: Yes Do you have to strain to urinate?: No Blood in urine?: No Urinary tract infection?: Yes Sexually transmitted disease?: No Injury to kidneys or bladder?: No Painful intercourse?: No Weak stream?: No Erection problems?: No Penile pain?: No  Gastrointestinal Nausea?: No Vomiting?: No Indigestion/heartburn?: No Diarrhea?: No Constipation?: No  Constitutional Fever: No Night sweats?: No Weight  loss?: No Fatigue?: Yes  Skin Skin rash/lesions?: No Itching?: No  Eyes Blurred vision?: No Double vision?: No  Ears/Nose/Throat Sore throat?: No Sinus problems?: Yes  Hematologic/Lymphatic Swollen glands?: Yes Easy bruising?: No  Cardiovascular Leg swelling?: No Chest pain?: No  Respiratory Cough?: No Shortness of breath?: No  Endocrine Excessive thirst?: No  Musculoskeletal Back pain?: Yes Joint pain?: No  Neurological Headaches?: Yes Dizziness?: No  Psychologic Depression?: Yes Anxiety?: Yes  Physical Exam: BP 138/78   Pulse 61   Temp (!) 97.2 F (36.2 C)   Ht 5\' 11"  (1.803 m)   Wt 290 lb (131.5 kg)   SpO2 98%   BMI 40.45 kg/m   Constitutional:  Well nourished. Alert and oriented, No acute distress. Diaphoretic.  HEENT: [place] AT, mask in place, Trachea midline, no masses. Cardiovascular: No clubbing, cyanosis, or edema. Respiratory: Normal respiratory effort, no increased work of breathing. GI: Abdomen is obese, soft, non tender, non distended, no abdominal masses. Liver and spleen not palpable.  Large umbilical hernia appreciated.  Stool sample for occult testing is not indicated.   GU: No CVA tenderness.  No bladder fullness or masses.  Patient with circumcised phallus.  Urethral meatus is patent.  No penile discharge. No penile lesions or rashes. Scrotum without lesions, cysts, rashes and/or edema.  Testicles are located scrotally bilaterally. No masses are appreciated in the testicles. Left and right epididymis are normal. Rectal: Patient with  normal sphincter tone. Anus and perineum without scarring or rashes. No rectal masses are appreciated. Prostate is approximately 40 grams, no nodules are appreciated. Seminal vesicles could not be palpated Skin: No rashes, bruises or suspicious lesions. Lymph: No inguinal adenopathy. Neurologic: Grossly intact, no focal deficits, moving all 4 extremities. Psychiatric: Normal mood and affect.  Laboratory  Data: Lab Results  Component Value Date   WBC 8.1 10/29/2016   HGB 13.3 10/29/2016   HCT 38.5 (L) 10/29/2016   MCV 88.8 10/29/2016   PLT 187 10/29/2016    Lab Results  Component Value Date   CREATININE 1.00 08/01/2017    No results found for: PSA  No results found for: TESTOSTERONE  No results found for: HGBA1C  Lab Results  Component Value Date   TSH 1.287 10/29/2016    No results found for: CHOL, HDL, CHOLHDL, VLDL, LDLCALC  Lab Results  Component Value Date   AST 19 10/26/2016   Lab Results  Component Value Date   ALT 22 10/26/2016   No components found for: ALKALINEPHOPHATASE No components found for: BILIRUBINTOTAL  No results found for: ESTRADIOL  Urinalysis Component     Latest Ref Rng & Units 02/21/2019          Color, Urine     YELLOW YELLOW  Appearance     CLEAR HAZY (A)  Specific Gravity, Urine     1.005 - 1.030 1.010  pH     5.0 - 8.0 6.5  Glucose, UA     Negative NEGATIVE  Hgb urine dipstick     NEGATIVE TRACE (A)  Bilirubin Urine     NEGATIVE NEGATIVE  Ketones, ur     NEGATIVE mg/dL TRACE (A)  Protein     NEGATIVE mg/dL NEGATIVE  Nitrite     NEGATIVE POSITIVE (A)  Leukocytes,Ua     NEGATIVE MODERATE (A)  Squamous Epithelial / LPF     0 - 5 NONE SEEN  WBC, UA     0 - 5 /hpf >50  RBC / HPF     0 - 5 RBC/hpf NONE SEEN  Bacteria, UA     None seen/Few MANY (A)   I have reviewed the labs.   Pertinent Imaging: CLINICAL DATA:  Urinary tract infection. Left-sided abdominal pain.  EXAM: ABDOMEN - 1 VIEW  COMPARISON:  None.  FINDINGS: Bowel gas pattern is normal. No abnormal abdominal calcifications. No significant bone abnormality. Midline soft tissue density overlying the upper sacrum likely represents a periumbilical hernia present on the prior CT scan.  IMPRESSION: Benign-appearing abdomen. Probable periumbilical hernia.   Electronically Signed   By: Lorriane Shire M.D.   On: 02/24/2019 16:23  I have  independently reviewed the films and do not appreciate any urinary  tract stones.    Assessment & Plan:    1. Urinary tract infection with ESBL Klebsiella oxytoca Likely due to multiple antibiotic exposure over the last three months resulting in an MDRO  Explained to the patient that this is a dangerous infection due to its highly resistant nature and may become life threatening  Mr. Baris will be referred for PICC line placement and Home health to be started on a 7 day course of ertapenem 1 gram IV every 24 hours (PICC line placement is scheduled for 8:30 am tomorrow morning) - Urinalysis, Complete - CULTURE, URINE COMPREHENSIVE - Bladder Scan (Post Void Residual) in office - PVR moderate - Abdomen 1 view (KUB); Future - stone not see Patient is advised that if they should start to experience pain that is not able to be controlled with pain medication, intractable nausea and/or vomiting and/or fevers greater than 103 or shaking chills to contact the office immediately or seek treatment in the emergency department for emergent intervention.    2. BPH with LU TS Continue tamsulosin 0.4 mg and finasteride 5 mg daily   Return in about 2 weeks (around 03/10/2019) for recheck .  These notes generated with voice recognition software. I apologize for typographical errors.  Zara Council, PA-C  [facility] [address]  [place], [place] [address] [phone]

## 2019-02-25 ENCOUNTER — Ambulatory Visit
Admit: 2019-02-25 | Discharge: 2019-02-25 | Disposition: A | Discharge status: Home / Self Care | Payer: Medicare HMO | Source: Ambulatory Visit | Attending: Urology | Admitting: Urology

## 2019-02-25 ENCOUNTER — Ambulatory Visit
Admission: RE | Admit: 2019-02-25 | Discharge: 2019-02-25 | Disposition: A | Discharge status: Home / Self Care | Payer: Self-pay | Source: Ambulatory Visit | Attending: Urology | Admitting: Urology

## 2019-02-25 DIAGNOSIS — N390 Urinary tract infection, site not specified: Secondary | ICD-10-CM | POA: Diagnosis present

## 2019-02-25 MED ORDER — SODIUM CHLORIDE FLUSH 0.9 % IV SOLN
INTRAVENOUS | Status: AC
  Administered 2019-02-25: 11:00:00 10 mL
  Filled 2019-02-25: qty 20

## 2019-02-25 MED ORDER — ERTAPENEM SODIUM 1 G IJ SOLR
1.0000 g | Freq: Once | INTRAMUSCULAR | Status: AC
  Administered 2019-02-25: 11:00:00 1000 mg via INTRAVENOUS
  Filled 2019-02-25: qty 1

## 2019-02-25 MED ORDER — HEPARIN SOD (PORK) LOCK FLUSH 100 UNIT/ML IV SOLN
INTRAVENOUS | Status: AC
  Administered 2019-02-25: 11:00:00 500 [IU]
  Filled 2019-02-25: qty 5

## 2019-02-25 NOTE — Progress Notes (Signed)
Patient arrived for PICC line placement.  On stretcher in room 24 resting comfortably.

## 2019-02-27 LAB — CULTURE, URINE COMPREHENSIVE

## 2019-03-01 ENCOUNTER — Other Ambulatory Visit
Admission: RE | Admit: 2019-03-01 | Discharge: 2019-03-01 | Disposition: A | Discharge status: Home / Self Care | Payer: Medicare HMO | Source: Ambulatory Visit | Attending: Urology | Admitting: Urology

## 2019-03-01 ENCOUNTER — Telehealth: Payer: Self-pay | Admitting: Urology

## 2019-03-01 ENCOUNTER — Telehealth: Payer: Self-pay | Admitting: Family Medicine

## 2019-03-01 DIAGNOSIS — B998 Other infectious disease: Secondary | ICD-10-CM | POA: Insufficient documentation

## 2019-03-01 DIAGNOSIS — Z1612 Extended spectrum beta lactamase (ESBL) resistance: Secondary | ICD-10-CM | POA: Insufficient documentation

## 2019-03-01 DIAGNOSIS — B961 Klebsiella pneumoniae [K. pneumoniae] as the cause of diseases classified elsewhere: Secondary | ICD-10-CM | POA: Insufficient documentation

## 2019-03-01 LAB — BASIC METABOLIC PANEL
Anion gap: 11 (ref 5–15)
BUN: 16 mg/dL (ref 8–23)
CO2: 26 mmol/L (ref 22–32)
Calcium: 8.9 mg/dL (ref 8.9–10.3)
Chloride: 104 mmol/L (ref 98–111)
Creatinine, Ser: 1.08 mg/dL (ref 0.61–1.24)
GFR calc Af Amer: >60 mL/min (ref >60)
GFR calc non Af Amer: >60 mL/min (ref >60)
Glucose, Bld: 90 mg/dL (ref 70–99)
Potassium: 4 mmol/L (ref 3.5–5.1)
Sodium: 141 mmol/L (ref 135–145)

## 2019-03-01 LAB — CBC WITH DIFFERENTIAL/PLATELET
Abs Immature Granulocytes: 0.05 10*3/uL (ref 0.00–0.07)
Basophils Absolute: 0.1 10*3/uL (ref 0.0–0.1)
Basophils Relative: 0 %
Eosinophils Absolute: 0.2 10*3/uL (ref 0.0–0.5)
Eosinophils Relative: 1 %
HCT: 43 % (ref 39.0–52.0)
Hemoglobin: 14.7 g/dL (ref 13.0–17.0)
Immature Granulocytes: 0 %
Lymphocytes Relative: 28 %
Lymphs Abs: 3.4 10*3/uL (ref 0.7–4.0)
MCH: 29.2 pg (ref 26.0–34.0)
MCHC: 34.2 g/dL (ref 30.0–36.0)
MCV: 85.3 fL (ref 80.0–100.0)
Monocytes Absolute: 1 10*3/uL (ref 0.1–1.0)
Monocytes Relative: 8 %
Neutro Abs: 7.3 10*3/uL (ref 1.7–7.7)
Neutrophils Relative %: 63 %
Platelets: 228 10*3/uL (ref 150–400)
RBC: 5.04 MIL/uL (ref 4.22–5.81)
RDW: 12.7 % (ref 11.5–15.5)
WBC: 11.9 10*3/uL — ABNORMAL HIGH (ref 4.0–10.5)
nRBC: 0 % (ref 0.0–0.2)

## 2019-03-01 LAB — C-REACTIVE PROTEIN: CRP: 1 mg/dL — ABNORMAL HIGH (ref <1.0)

## 2019-03-01 LAB — SEDIMENTATION RATE: Sed Rate: 16 mm/hr (ref 0–20)

## 2019-03-01 NOTE — Telephone Encounter (Signed)
Per [place] patient was notified to take his protonix twice a day and that someone will out there this afternoon or in the morning to draw a CBC to make sure he is not becoming anemic.  Patient denies fevers, feeling light headed, etc.  He states he has a mild head ache   I informed him that he would only need to complete 5 days of the abx and his urine culture was negative

## 2019-03-01 NOTE — Telephone Encounter (Signed)
-----   Message from Nori Riis, PA-C sent at 03/01/2019  8:16 AM EST ----- Would you call Ronnie Cunningham and check to see how he is doing?  He will also need a follow up visit this week.

## 2019-03-01 NOTE — Telephone Encounter (Signed)
Would you call Mr. Steffensmeier and tell him that I spoke to [employer] in GI and he recommends he increase his Protonix to twice daily?  We also need to check his CBC today.

## 2019-03-01 NOTE — Telephone Encounter (Signed)
Appt made and pt is aware.

## 2019-03-02 ENCOUNTER — Other Ambulatory Visit: Payer: Self-pay

## 2019-03-03 NOTE — Telephone Encounter (Signed)
Would you call Ronnie Cunningham and see if he is still experiencing coffee ground looking stool?

## 2019-03-03 NOTE — Telephone Encounter (Signed)
Ronnie Cunningham spoke to patient's wife and patient states his stool looks like a hand full of dirt.

## 2019-03-10 NOTE — Progress Notes (Signed)
03/11/2019 2:38 PM   Ronnie Cunningham 07/25/1950 [id number]  Referring provider: Tracie Harrier, MD [address] [facility] [place],  [place] [address]  Chief Complaint  Patient presents with  . Follow-up    HPI: Ronnie Cunningham is a 69 year old male with ESBL Klebsiella oxytoca who has completed a course of Invanz.    He presented to [facility] urgent care on February 21, 2019 with the complaints of sinus pressure, sinus drainage and dysuria.    On January 01, 2019 he presented to his primary care physician, Dr. Ginette Pitman, with a complaint of left-sided abdominal pain associated with fatigue, diarrhea and blood in his stool for 2 to 3 weeks.  He was treated for presumptive diverticulitis with 10 days of Cipro 500 mg twice daily and Flagyl 500 mg 3 times daily.    In the early part of December he was suffering with sinusitis, he was prescribed Biaxin 500 mg twice daily for 10 days.  His symptoms did not resolve, so on  February 15, 2019, he was prescribed doxycycline 100 mg twice daily for 10 days.  His symptoms of sinus pressure and post nasal drainage did not improve.  For the few days prior to his visit to [facility] Urgent care, he stated he felt so awful he just stayed in the bed as he was experiencing fevers of 102, chills, flulike symptoms, body aches, headaches, a minor pain in the groin area and burning with urination. It was then [facility].  His vital signs were stable and he was afebrile at the time of his visit.    His UA was yellow hazy, urine dip was positive for trace blood, trace ketone, nitrite positive and moderate leukocytes, microscopically was positive for greater than 50 WBCs and many bacteria.  His doxycycline was discontinued and he was placed on Augmentin awaiting urine culture results and sensitivities.  He sent our office a MyChart message last evening explaining his symptoms and asked [place] to be on the look out for his  urine culture results from the [facility] urgent care visit.  Urine culture results were finalized and were positive for ESBL Klebsiella oxytoca.  His UA in our office is negative.  His PVR is 145 mL.  KUB 02/24/2019 no urinary tract stones seen.  Urine culture was negative.   He was started on Invanz and has completed his course.  He was having coffee ground looking BM's.  GI was messaged and it was recommended to increase his PPI to twice daily, which he did.  He states his BM's have normalized.    Today, he is experiencing some left groin pain that radiates towards his perineum.  This has been occurring since he was started on the Invanz.  He feels like there are crystals in a tube down there.  He has not had any swelling in the scrotum or abdomen.  He has no difficulty urinating.  Patient denies any gross hematuria, dysuria or suprapubic/flank pain.  Patient denies any fevers, chills, nausea or vomiting.   PVR is 0 mL.    A contrast CT in 12/2016 revealed left-sided mesenteric fat containing direct inguinal hernia which contains a small portion of the left-sided the urinary bladder, morphologically similar to the 04/2011 examination though could serve as the etiology of the patient's intermittent left lower quadrant abdominal pain.  PMH: Past Medical History:  Diagnosis Date  . Asthma   . BPH (benign prostatic hyperplasia)   .  Diabetes mellitus without complication ([place])   . Hypertension   . Morbid obesity ([place])   . Seasonal allergies   . Sleep apnea   . Spasmodic dysphonia     Surgical History: Past Surgical History:  Procedure Laterality Date  . ADENOIDECTOMY    . CARDIAC SURGERY    . TONSILLECTOMY      Home Medications:  Allergies as of 03/11/2019      Reactions   Pollen Extract Other (See Comments)   Tape Other (See Comments)      Medication List       Accurate as of March 11, 2019 11:59 PM. If you have any questions, ask your nurse or doctor.          amoxicillin-clavulanate 875-125 MG tablet Commonly known as: AUGMENTIN Take 1 tablet by mouth every 12 (twelve) hours.   aspirin 325 MG tablet Take 325 mg by mouth daily.   atorvastatin 20 MG tablet Commonly known as: LIPITOR Take 20 mg by mouth at bedtime.   Co Q 10 100 MG Caps Take 2 capsules by mouth daily.   diltiazem 240 MG 24 hr capsule Commonly known as: CARDIZEM CD Take 1 capsule (240 mg total) by mouth daily. What changed: how much to take   Eliquis 5 MG Tabs tablet Generic drug: apixaban SMARTSIG:1 Tablet(s) By Mouth Every 12 Hours   escitalopram 20 MG tablet Commonly known as: LEXAPRO Take 20 mg by mouth daily.   finasteride 5 MG tablet Commonly known as: PROSCAR Take 1 tablet (5 mg total) by mouth daily.   fluticasone 50 MCG/ACT nasal spray Commonly known as: FLONASE Place 2 sprays into both nostrils daily.   metFORMIN 500 MG tablet Commonly known as: GLUCOPHAGE Take 1,000 mg by mouth 2 (two) times daily with a meal.   metoprolol tartrate 25 MG tablet Commonly known as: LOPRESSOR Take 1 tablet (25 mg total) by mouth 2 (two) times daily.   montelukast 10 MG tablet Commonly known as: SINGULAIR Take 10 mg by mouth daily as needed.   multivitamin with minerals Tabs tablet Take 1 tablet by mouth daily.   pantoprazole 40 MG tablet Commonly known as: PROTONIX Take 40 mg by mouth daily.   ProAir HFA 108 (90 Base) MCG/ACT inhaler Generic drug: albuterol Inhale into the lungs.   tamsulosin 0.4 MG Caps capsule Commonly known as: FLOMAX Take 1 capsule (0.4 mg total) by mouth daily after supper.   traZODone 50 MG tablet Commonly known as: DESYREL Take 50 mg by mouth at bedtime.       Allergies:  Allergies  Allergen Reactions  . Pollen Extract Other (See Comments)  . Tape Other (See Comments)    Family History: Family History  Problem Relation Age of Onset  . Cancer Mother   . Hypertension Mother   . Cancer Father   . Hypertension  Father     Social History:  reports that he has quit smoking. He has never used smokeless tobacco. He reports current alcohol use. He reports that he does not use drugs.  ROS: UROLOGY Frequent Urination?: No Hard to postpone urination?: No Burning/pain with urination?: No Get up at night to urinate?: No Leakage of urine?: No Urine stream starts and stops?: No Trouble starting stream?: No Do you have to strain to urinate?: No Blood in urine?: No Urinary tract infection?: Yes Sexually transmitted disease?: No Injury to kidneys or bladder?: No Painful intercourse?: No Weak stream?: No Erection problems?: No Penile pain?: No  Gastrointestinal Nausea?:  No Vomiting?: No Indigestion/heartburn?: Yes Diarrhea?: No Constipation?: No  Constitutional Fever: No Night sweats?: No Weight loss?: No Fatigue?: Yes  Skin Skin rash/lesions?: No Itching?: No  Eyes Blurred vision?: No Double vision?: No  Ears/Nose/Throat Sore throat?: Yes Sinus problems?: Yes  Hematologic/Lymphatic Swollen glands?: No Easy bruising?: No  Cardiovascular Leg swelling?: No Chest pain?: No  Respiratory Cough?: No Shortness of breath?: No  Endocrine Excessive thirst?: No  Musculoskeletal Back pain?: Yes Joint pain?: No  Neurological Headaches?: Yes Dizziness?: No  Psychologic Depression?: No Anxiety?: No  Physical Exam: BP (!) 145/79   Pulse 69   Ht 5\' 11"  (1.803 m)   Wt 297 lb 4.8 oz (134.9 kg)   BMI 41.46 kg/m   Constitutional:  Well nourished. Alert and oriented, No acute distress. HEENT:  AT, mask in place.  Trachea midline, no masses. Cardiovascular: No clubbing, cyanosis, or edema. Respiratory: Normal respiratory effort, no increased work of breathing. GI: Abdomen is soft, non tender, non distended, no abdominal masses. Liver and spleen not palpable.  Umbilical hernia appreciated.  Stool sample for occult testing is not indicated.   GU: No CVA tenderness.  No  bladder fullness or masses.  Patient with circumcised phallus.  Urethral meatus is patent.  No penile discharge. No penile lesions or rashes. Scrotum without lesions, cysts, rashes and/or edema.  Testicles are located scrotally bilaterally. No masses are appreciated in the testicles. Left and right epididymis are normal. Rectal: Patient with  normal sphincter tone. Anus and perineum without scarring or rashes. No rectal masses are appreciated. Prostate is approximately 40 grams, no nodules are appreciated. Seminal vesicles could not be palpated.   Skin: No rashes, bruises or suspicious lesions. Lymph: No inguinal adenopathy. Neurologic: Grossly intact, no focal deficits, moving all 4 extremities. Psychiatric: Normal mood and affect.  Laboratory Data: Lab Results  Component Value Date   WBC 11.9 (H) 03/01/2019   HGB 14.7 03/01/2019   HCT 43.0 03/01/2019   MCV 85.3 03/01/2019   PLT 228 03/01/2019    Lab Results  Component Value Date   CREATININE 1.08 03/01/2019    No results found for: PSA  No results found for: TESTOSTERONE  No results found for: HGBA1C  Lab Results  Component Value Date   TSH 1.287 10/29/2016    No results found for: CHOL, HDL, CHOLHDL, VLDL, LDLCALC  Lab Results  Component Value Date   AST 19 10/26/2016   Lab Results  Component Value Date   ALT 22 10/26/2016   No components found for: ALKALINEPHOPHATASE No components found for: BILIRUBINTOTAL  No results found for: ESTRADIOL  Urinalysis Component     Latest Ref Rng & Units 02/21/2019          Color, Urine     YELLOW YELLOW  Appearance     CLEAR HAZY (A)  Specific Gravity, Urine     1.005 - 1.030 1.010  pH     5.0 - 8.0 6.5  Glucose, UA     Negative NEGATIVE  Hgb urine dipstick     NEGATIVE TRACE (A)  Bilirubin Urine     NEGATIVE NEGATIVE  Ketones, ur     NEGATIVE mg/dL TRACE (A)  Protein     NEGATIVE mg/dL NEGATIVE  Nitrite     NEGATIVE POSITIVE (A)  Leukocytes,Ua      NEGATIVE MODERATE (A)  Squamous Epithelial / LPF     0 - 5 NONE SEEN  WBC, UA     0 - 5 /  hpf >50  RBC / HPF     0 - 5 RBC/hpf NONE SEEN  Bacteria, UA     None seen/Few MANY (A)   I have reviewed the labs.   Pertinent Imaging: CLINICAL DATA:  Severe urinary tract infection. Heart arrhythmia. Left lower quadrant abdominal pain for the past several days.  EXAM: CT ABDOMEN AND PELVIS WITH CONTRAST  TECHNIQUE: Multidetector CT imaging of the abdomen and pelvis was performed using the standard protocol following bolus administration of intravenous contrast.  CONTRAST:  139mL ISOVUE-300 IOPAMIDOL (ISOVUE-300) INJECTION 61%  COMPARISON:  10/02/2012; 04/17/2011  FINDINGS: Lower chest: Limited visualization of the lower thorax is negative for focal airspace opacity or pleural effusion.  Normal heart size.  No pericardial effusion.  Hepatobiliary: Normal hepatic contour. There is diffuse decreased attenuation hepatic parenchyma on this postcontrast examination suggestive of hepatic steatosis. There is a minimal amount of focal fatty infiltration adjacent to the fissure for the ligamentum teres. There is a punctate (approximately 8 mm) gallstone within the neck of an otherwise normal-appearing gallbladder. No gallbladder wall thickening or pericholecystic fluid. No intrahepatic bili duct dilatation. No ascites.  Pancreas: Normal appearance of the pancreas  Spleen: Normal appearance of the spleen. Note is made of several small splenules.  Adrenals/Urinary Tract: There is symmetric enhancement and excretion of the bilateral kidneys. No definite renal stones on this postcontrast examination. There are 2 hypoattenuating nonenhancing left-sided renal cysts. No discrete right-sided renal lesions no urinary obstruction or perinephric stranding.  Normal appearance of the bilateral adrenal glands.  Small portion of the left-sided the urinary bladder is contain within  a left-sided direct mesenteric fat containing inguinal hernia. Otherwise, normal appearance of the urinary bladder.  Stomach/Bowel: Ingested enteric contrast extends to the level of the distal small bowel. Moderate colonic stool burden without evidence of enteric obstruction. Normal appearance of the terminal ileum and retrocecal appendix. No pneumoperitoneum, pneumatosis or portal venous gas.  Vascular/Lymphatic: Scattered atherosclerotic plaque within a normal caliber abdominal aorta. The major branch vessels of the abdominal aorta appear patent on this non CTA examination.  No bulky retroperitoneal, mesenteric, pelvic or inguinal lymphadenopathy.  Reproductive: Normal appearance of the prostate gland. No free fluid within the pelvic cul-de-sac.  Other: Note is made of a small (approximately 5.6 x 4.2 cm) left-sided mesenteric fat containing direct inguinal hernia which is also noted to contain a very small portion of the left-side of the urinary bladder (coronal image 79, series 602), similar to the 04/2011 examination.  Note is made of 2 adjacent mesenteric fat containing narrow neck periumbilical hernias with dominant hernia measuring 4.0 x 4.0 cm (sagittal image 107, series 603) and adjacent hernia measuring approximately 2.6 x 1.9 cm, also similar to the 04/2011 examination.  Musculoskeletal: No acute or aggressive osseous abnormalities. Stigmata of DISH throughout the thoracic spine. Severe DDD of L5-S1 with disc space height loss, endplate irregularity and sclerosis.  IMPRESSION: 1. 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 2. Cholelithiasis without evidence of cholecystitis. 3. Suspected hepatic steatosis. Correlation with  LFTs is recommended. 4.  Aortic Atherosclerosis (ICD10-I70.0).   Electronically Signed   By: Sandi Mariscal M.D.   On: 12/11/2016 17:14    CLINICAL DATA:  Urinary tract infection. Left-sided abdominal pain.  EXAM: ABDOMEN - 1 VIEW  COMPARISON:  None.  FINDINGS: Bowel gas pattern is normal. No abnormal abdominal calcifications. No significant bone abnormality. Midline soft tissue density overlying the upper sacrum likely represents a periumbilical hernia present on the prior CT scan.  IMPRESSION: Benign-appearing abdomen. Probable periumbilical hernia.   Electronically Signed   By: Lorriane Shire M.D.   On: 02/24/2019 16:23   Assessment & Plan:    1. Urinary tract infection with ESBL Klebsiella oxytoca Resolved  2. BPH with LU TS Continue tamsulosin 0.4 mg and finasteride 5 mg daily   3. Herniation of the urinary bladder Explained to the patient that his current symptomatology may be the result of further herniation of his bladder into the left inguinal region and recommended a CT scan for further evaluation.  Patient stated that he will need time to contact his insurance company and review his current financial status to see if he is able to cover the cost of pursuing a CAT scan at this time.    Return for patient to call for follow up .  These notes generated with voice recognition software. I apologize for typographical errors.  Zara Council, PA-C  [facility] [address]  [place], [place] [address] [phone]

## 2019-03-11 ENCOUNTER — Ambulatory Visit: Payer: Medicare HMO | Admitting: Urology

## 2019-03-11 ENCOUNTER — Encounter: Payer: Self-pay | Admitting: Urology

## 2019-03-11 ENCOUNTER — Other Ambulatory Visit: Payer: Self-pay

## 2019-03-11 VITALS — BP 145/79 | HR 69 | Ht 71.0 in | Wt 297.3 lb

## 2019-03-11 DIAGNOSIS — Z1612 Extended spectrum beta lactamase (ESBL) resistance: Secondary | ICD-10-CM

## 2019-03-11 DIAGNOSIS — K4090 Unilateral inguinal hernia, without obstruction or gangrene, not specified as recurrent: Secondary | ICD-10-CM

## 2019-03-11 DIAGNOSIS — N138 Other obstructive and reflux uropathy: Secondary | ICD-10-CM

## 2019-03-11 DIAGNOSIS — A498 Other bacterial infections of unspecified site: Secondary | ICD-10-CM

## 2019-03-11 DIAGNOSIS — N401 Enlarged prostate with lower urinary tract symptoms: Secondary | ICD-10-CM | POA: Diagnosis not present

## 2019-03-11 LAB — BLADDER SCAN AMB NON-IMAGING: Scan Result: 0

## 2019-03-19 ENCOUNTER — Telehealth: Payer: Self-pay | Admitting: Urology

## 2019-03-19 NOTE — Telephone Encounter (Signed)
Would you reach out to Ronnie Cunningham and see if he is made a decision regarding undergoing a CT scan for the further evaluation of his bladder dropping into his left inguinal canal?

## 2019-03-19 NOTE — Telephone Encounter (Signed)
LMOM for patient to call back.

## 2019-03-22 NOTE — Telephone Encounter (Signed)
2nd attempt to reach patient. LMOM.  

## 2019-03-24 NOTE — Progress Notes (Signed)
03/25/2019 5:49 PM   [name] [birth year] [id number]  Referring provider: Tracie Harrier, MD [address] [facility] [place],  [place] [address]  Chief Complaint  Patient presents with  . Penis Pain    HPI: [name] is a 69 year old male with a history of ESBL Klebsiella oxytoca who has completed a course of Invanz who is now have painful urination and scrotal pain.   He presented to [facility] urgent care on February 21, 2019 with the complaints of sinus pressure, sinus drainage and dysuria.    On January 01, 2019 he presented to his primary care physician, Dr. Ginette Pitman, with a complaint of left-sided abdominal pain associated with fatigue, diarrhea and blood in his stool for 2 to 3 weeks.  He was treated for presumptive diverticulitis with 10 days of Cipro 500 mg twice daily and Flagyl 500 mg 3 times daily.    In the early part of December he was suffering with sinusitis, he was prescribed Biaxin 500 mg twice daily for 10 days.  His symptoms did not resolve, so on  February 15, 2019, he was prescribed doxycycline 100 mg twice daily for 10 days.  His symptoms of sinus pressure and post nasal drainage did not improve.  For the few days prior to his visit to [facility], he stated he felt so awful he just stayed in the bed as he was experiencing fevers of 102, chills, flulike symptoms, body aches, headaches, a minor pain in the groin area and burning with urination. It was then [facility]  His vital signs were stable and he was afebrile at the time of his visit.    His UA was yellow hazy, urine dip was positive for trace blood, trace ketone, nitrite positive and moderate leukocytes, microscopically was positive for greater than 50 WBCs and many bacteria.  His doxycycline was discontinued and he was placed on Augmentin awaiting urine culture results and sensitivities.  He sent our office a MyChart message last evening  explaining his symptoms and asked [place] to be on the look out for his urine culture results from the [facility] urgent care visit.  Urine culture results were finalized and were positive for ESBL Klebsiella oxytoca.  His UA in our office was negative.  His PVR was 145 mL.  KUB 02/24/2019 no urinary tract stones seen.  Urine culture was negative.   He was started on Invanz and had completed his course 03/03/2019.  He was having coffee ground looking BM's.  GI was messaged and it was recommended to increase his PPI to twice daily, which he did.  He stated his BM's have normalized.    He was having groin pain that radiated to his perineum at his return visit on 03/11/2019.  His exam was benign.  His PVR was 0 mL.  A contrast CT in 12/2016 revealed left-sided mesenteric fat containing direct inguinal hernia which contains a small portion of the left-sided the urinary bladder, morphologically similar to the 04/2011 examination though could serve as the etiology of the patient's intermittent left lower quadrant abdominal pain.  A follow up CT was recommended, but he wanted to check with his insurance company and look at his current medical expenses prior to going forward with the study.    Today, he continues to have the same symptomatology as his visit on March 11, 2019 visit.  Patient denies any modifying or aggravating factors.  Patient denies  any gross hematuria, dysuria or suprapubic/flank pain.  Patient denies any fevers, chills, nausea or vomiting.  His UA yellow clear, negative dip, 0-5 WBCs no RBCs and 0-10 epithelial cells on microscopic exam.  PMH: Past Medical History:  Diagnosis Date  . Asthma   . BPH (benign prostatic hyperplasia)   . Diabetes mellitus without complication ([place])   . Hypertension   . Morbid obesity ([place])   . Seasonal allergies   . Sleep apnea   . Spasmodic dysphonia     Surgical History: Past Surgical History:  Procedure Laterality Date  . ADENOIDECTOMY    . CARDIAC  SURGERY    . TONSILLECTOMY      Home Medications:  Allergies as of 03/25/2019      Reactions   Pollen Extract Other (See Comments)   Tape Other (See Comments)      Medication List       Accurate as of March 25, 2019 11:59 PM. If you have any questions, ask your nurse or doctor.        STOP taking these medications   amoxicillin-clavulanate 875-125 MG tablet Commonly known as: AUGMENTIN Stopped by: Zara Council, PA-C     TAKE these medications   aspirin 325 MG tablet Take 325 mg by mouth daily.   atorvastatin 20 MG tablet Commonly known as: LIPITOR Take 20 mg by mouth at bedtime.   Co Q 10 100 MG Caps Take 2 capsules by mouth daily.   diltiazem 240 MG 24 hr capsule Commonly known as: CARDIZEM CD Take 1 capsule (240 mg total) by mouth daily. What changed: how much to take   Eliquis 5 MG Tabs tablet Generic drug: apixaban SMARTSIG:1 Tablet(s) By Mouth Every 12 Hours   escitalopram 20 MG tablet Commonly known as: LEXAPRO Take 20 mg by mouth daily.   finasteride 5 MG tablet Commonly known as: PROSCAR Take 1 tablet (5 mg total) by mouth daily.   fluticasone 50 MCG/ACT nasal spray Commonly known as: FLONASE Place 2 sprays into both nostrils daily.   metFORMIN 500 MG tablet Commonly known as: GLUCOPHAGE Take 1,000 mg by mouth 2 (two) times daily with a meal.   metFORMIN 1000 MG tablet Commonly known as: GLUCOPHAGE Take 1,000 mg by mouth 2 (two) times daily.   metoprolol tartrate 25 MG tablet Commonly known as: LOPRESSOR Take 1 tablet (25 mg total) by mouth 2 (two) times daily.   montelukast 10 MG tablet Commonly known as: SINGULAIR Take 10 mg by mouth daily as needed.   multivitamin with minerals Tabs tablet Take 1 tablet by mouth daily.   OneTouch Delica Lancets 99991111 Misc TEST BG 3 TIMES DAILY AS DIRECTED   OneTouch Ultra test strip Generic drug: glucose blood USE ONE DAILY   pantoprazole 40 MG tablet Commonly known as: PROTONIX Take 40  mg by mouth daily.   ProAir HFA 108 (90 Base) MCG/ACT inhaler Generic drug: albuterol Inhale into the lungs.   tamsulosin 0.4 MG Caps capsule Commonly known as: FLOMAX Take 1 capsule (0.4 mg total) by mouth daily after supper.   traZODone 50 MG tablet Commonly known as: DESYREL Take 50 mg by mouth at bedtime.       Allergies:  Allergies  Allergen Reactions  . Pollen Extract Other (See Comments)  . Tape Other (See Comments)    Family History: Family History  Problem Relation Age of Onset  . Cancer Mother   . Hypertension Mother   . Cancer Father   . Hypertension Father  Social History:  reports that he has quit smoking. He has never used smokeless tobacco. He reports current alcohol use. He reports that he does not use drugs.  ROS: UROLOGY Frequent Urination?: No Hard to postpone urination?: No Burning/pain with urination?: Yes Get up at night to urinate?: No Leakage of urine?: No Urine stream starts and stops?: No Trouble starting stream?: No Do you have to strain to urinate?: No Blood in urine?: No Urinary tract infection?: No Sexually transmitted disease?: No Injury to kidneys or bladder?: No Painful intercourse?: No Weak stream?: No Erection problems?: No Penile pain?: No  Gastrointestinal Nausea?: No Vomiting?: No Indigestion/heartburn?: No Diarrhea?: No Constipation?: No  Constitutional Fever: No Night sweats?: No Weight loss?: No Fatigue?: No  Skin Skin rash/lesions?: No Itching?: No  Eyes Blurred vision?: No Double vision?: No  Ears/Nose/Throat Sore throat?: No Sinus problems?: Yes  Hematologic/Lymphatic Swollen glands?: No Easy bruising?: Yes  Cardiovascular Leg swelling?: No Chest pain?: No  Respiratory Cough?: No Shortness of breath?: No  Endocrine Excessive thirst?: No  Musculoskeletal Back pain?: Yes Joint pain?: No  Neurological Headaches?: No Dizziness?: No  Psychologic Depression?: No Anxiety?:  No  Physical Exam: BP 124/76   Ht 5\' 11"  (1.803 m)   Wt 295 lb (133.8 kg)   BMI 41.14 kg/m   Constitutional:  Well nourished. Alert and oriented, No acute distress. HEENT: [place] AT, moist mucus membranes.  Trachea midline, no masses. Cardiovascular: No clubbing, cyanosis, or edema. Respiratory: Normal respiratory effort, no increased work of breathing. GI: Abdomen is soft, non tender, non distended, no abdominal masses. Liver and spleen not palpable.  Large umbilical hernia appreciated.  Stool sample for occult testing is not indicated.   GU: No CVA tenderness.  No bladder fullness or masses.  Patient with circumcised phallus.  Urethral meatus is patent.  No penile discharge. No penile lesions or rashes. Scrotum without lesions, cysts, rashes and/or edema.  Testicles are located scrotally bilaterally. No masses are appreciated in the testicles. Left and right epididymis are normal. Rectal: Patient with  normal sphincter tone. Anus and perineum without scarring or rashes. No rectal masses are appreciated. Prostate is approximately 40 grams, no nodules are appreciated. Seminal vesicles could not be palpated. Skin: No rashes, bruises or suspicious lesions. Lymph: No  inguinal adenopathy. Neurologic: Grossly intact, no focal deficits, moving all 4 extremities. Psychiatric: Normal mood and affect.  Laboratory Data: Lab Results  Component Value Date   WBC 11.9 (H) 03/01/2019   HGB 14.7 03/01/2019   HCT 43.0 03/01/2019   MCV 85.3 03/01/2019   PLT 228 03/01/2019    Lab Results  Component Value Date   CREATININE 1.08 03/01/2019    No results found for: PSA  No results found for: TESTOSTERONE  No results found for: HGBA1C  Lab Results  Component Value Date   TSH 1.287 10/29/2016    No results found for: CHOL, HDL, CHOLHDL, VLDL, LDLCALC  Lab Results  Component Value Date   AST 19 10/26/2016   Lab Results  Component Value Date   ALT 22 10/26/2016   No components found for:  ALKALINEPHOPHATASE No components found for: BILIRUBINTOTAL  No results found for: ESTRADIOL  Urinalysis Component     Latest Ref Rng & Units 03/25/2019          Specific Gravity, UA     1.005 - 1.030 1.015  pH, UA     5.0 - 7.5 6.0  Color, UA     Yellow Yellow  Appearance Ur     Clear Clear  Leukocytes,UA     Negative Negative  Protein,UA     Negative/Trace Negative  Glucose, UA     Negative Negative  Ketones, UA     Negative Negative  RBC, UA     Negative Negative  Bilirubin, UA     Negative Negative  Urobilinogen, Ur     0.2 - 1.0 mg/dL 0.2  Nitrite, UA     Negative Negative  Microscopic Examination      See below:   Component     Latest Ref Rng & Units 03/25/2019          WBC, UA     0 - 5 /hpf 0-5  RBC     0 - 2 /hpf None seen  Epithelial Cells (non renal)     0 - 10 /hpf 0-10  Bacteria, UA     None seen/Few None seen   I have reviewed the labs.   Pertinent Imaging: CLINICAL DATA:  Severe urinary tract infection. Heart arrhythmia. Left lower quadrant abdominal pain for the past several days.  EXAM: CT ABDOMEN AND PELVIS WITH CONTRAST  TECHNIQUE: Multidetector CT imaging of the abdomen and pelvis was performed using the standard protocol following bolus administration of intravenous contrast.  CONTRAST:  124mL ISOVUE-300 IOPAMIDOL (ISOVUE-300) INJECTION 61%  COMPARISON:  10/02/2012; 04/17/2011  FINDINGS: Lower chest: Limited visualization of the lower thorax is negative for focal airspace opacity or pleural effusion.  Normal heart size.  No pericardial effusion.  Hepatobiliary: Normal hepatic contour. There is diffuse decreased attenuation hepatic parenchyma on this postcontrast examination suggestive of hepatic steatosis. There is a minimal amount of focal fatty infiltration adjacent to the fissure for the ligamentum teres. There is a punctate (approximately 8 mm) gallstone within the neck of an otherwise normal-appearing  gallbladder. No gallbladder wall thickening or pericholecystic fluid. No intrahepatic bili duct dilatation. No ascites.  Pancreas: Normal appearance of the pancreas  Spleen: Normal appearance of the spleen. Note is made of several small splenules.  Adrenals/Urinary Tract: There is symmetric enhancement and excretion of the bilateral kidneys. No definite renal stones on this postcontrast examination. There are 2 hypoattenuating nonenhancing left-sided renal cysts. No discrete right-sided renal lesions no urinary obstruction or perinephric stranding.  Normal appearance of the bilateral adrenal glands.  Small portion of the left-sided the urinary bladder is contain within a left-sided direct mesenteric fat containing inguinal hernia. Otherwise, normal appearance of the urinary bladder.  Stomach/Bowel: Ingested enteric contrast extends to the level of the distal small bowel. Moderate colonic stool burden without evidence of enteric obstruction. Normal appearance of the terminal ileum and retrocecal appendix. No pneumoperitoneum, pneumatosis or portal venous gas.  Vascular/Lymphatic: Scattered atherosclerotic plaque within a normal caliber abdominal aorta. The major branch vessels of the abdominal aorta appear patent on this non CTA examination.  No bulky retroperitoneal, mesenteric, pelvic or inguinal lymphadenopathy.  Reproductive: Normal appearance of the prostate gland. No free fluid within the pelvic cul-de-sac.  Other: Note is made of a small (approximately 5.6 x 4.2 cm) left-sided mesenteric fat containing direct inguinal hernia which is also noted to contain a very small portion of the left-side of the urinary bladder (coronal image 79, series 602), similar to the 04/2011 examination.  Note is made of 2 adjacent mesenteric fat containing narrow neck periumbilical hernias with dominant hernia measuring 4.0 x 4.0 cm (sagittal image 107, series 603) and  adjacent hernia measuring approximately 2.6 x 1.9 cm, also  similar to the 04/2011 examination.  Musculoskeletal: No acute or aggressive osseous abnormalities. Stigmata of DISH throughout the thoracic spine. Severe DDD of L5-S1 with disc space height loss, endplate irregularity and sclerosis.  IMPRESSION: 1. 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 2. Cholelithiasis without evidence of cholecystitis. 3. Suspected hepatic steatosis. Correlation with LFTs is recommended. 4.  Aortic Atherosclerosis (ICD10-I70.0).   Electronically Signed   By: Sandi Mariscal M.D.   On: 12/11/2016 17:14    CLINICAL DATA:  Urinary tract infection. Left-sided abdominal pain.  EXAM: ABDOMEN - 1 VIEW  COMPARISON:  None.  FINDINGS: Bowel gas pattern is normal. No abnormal abdominal calcifications. No significant bone abnormality. Midline soft tissue density overlying the upper sacrum likely represents a periumbilical hernia present on the prior CT scan.  IMPRESSION: Benign-appearing abdomen. Probable periumbilical hernia.   Electronically Signed   By: Lorriane Shire M.D.   On: 02/24/2019 16:23   Assessment & Plan:    1. LLQ pain UA is negative and exam is benign again today Discussed obtaining the CT scan at this time, the patient still needs to evaluate his financial position to see if he can afford pursuing a scan at this time I will send the urine for culture to rule out any indolent infection, but I am doubtful his symptoms are due to genitourinary infection due to benign looking urine Patient is advised that if they should start to experience pain that is not able to be controlled with pain medication, intractable nausea and/or vomiting  and/or fevers greater than 103 or shaking chills to contact the office immediately or seek treatment in the emergency department for emergent intervention.    2. BPH with LU TS Continue tamsulosin 0.4 mg and finasteride 5 mg daily   3. Herniation of the urinary bladder Explained to the patient that his current symptomatology may be the result of further herniation of his bladder into the left inguinal region and recommended a CT scan for further evaluation.  Patient stated that he will need time to contact his insurance company and review his current financial status to see if he is able to cover the cost of pursuing a CAT scan at this time.  Return for he will call to schedule .  These notes generated with voice recognition software. I apologize for typographical errors.  Zara Council, PA-C  [facility] [address]  [place], [place] [address] [phone]

## 2019-03-25 ENCOUNTER — Other Ambulatory Visit: Payer: Self-pay

## 2019-03-25 ENCOUNTER — Encounter: Payer: Self-pay | Admitting: Urology

## 2019-03-25 ENCOUNTER — Ambulatory Visit: Payer: Medicare HMO | Admitting: Urology

## 2019-03-25 VITALS — BP 124/76 | Ht 71.0 in | Wt 295.0 lb

## 2019-03-25 DIAGNOSIS — N138 Other obstructive and reflux uropathy: Secondary | ICD-10-CM | POA: Diagnosis not present

## 2019-03-25 DIAGNOSIS — N453 Epididymo-orchitis: Secondary | ICD-10-CM | POA: Diagnosis not present

## 2019-03-25 DIAGNOSIS — K4090 Unilateral inguinal hernia, without obstruction or gangrene, not specified as recurrent: Secondary | ICD-10-CM | POA: Diagnosis not present

## 2019-03-25 DIAGNOSIS — N401 Enlarged prostate with lower urinary tract symptoms: Secondary | ICD-10-CM

## 2019-03-26 LAB — URINALYSIS, COMPLETE
Bilirubin, UA: NEGATIVE
Glucose, UA: NEGATIVE
Ketones, UA: NEGATIVE
Leukocytes,UA: NEGATIVE
Nitrite, UA: NEGATIVE
Protein,UA: NEGATIVE
RBC, UA: NEGATIVE
Specific Gravity, UA: 1.015 (ref 1.005–1.030)
Urobilinogen, Ur: 0.2 mg/dL (ref 0.2–1.0)
pH, UA: 6 (ref 5.0–7.5)

## 2019-03-26 LAB — MICROSCOPIC EXAMINATION
Bacteria, UA: NONE SEEN
RBC, Urine: NONE SEEN /hpf (ref 0–2)

## 2019-03-29 LAB — CULTURE, URINE COMPREHENSIVE

## 2019-05-06 ENCOUNTER — Other Ambulatory Visit
Admission: RE | Admit: 2019-05-06 | Discharge: 2019-05-06 | Disposition: A | Discharge status: Home / Self Care | Payer: Medicare HMO | Source: Ambulatory Visit | Attending: Internal Medicine | Admitting: Internal Medicine

## 2019-05-06 DIAGNOSIS — Z20822 Contact with and (suspected) exposure to covid-19: Secondary | ICD-10-CM | POA: Diagnosis not present

## 2019-05-06 DIAGNOSIS — Z01812 Encounter for preprocedural laboratory examination: Secondary | ICD-10-CM | POA: Diagnosis present

## 2019-05-07 ENCOUNTER — Encounter: Payer: Self-pay | Admitting: Internal Medicine

## 2019-05-07 LAB — SARS CORONAVIRUS 2 (TAT 6-24 HRS): SARS Coronavirus 2: NEGATIVE

## 2019-05-10 ENCOUNTER — Other Ambulatory Visit: Payer: Self-pay

## 2019-05-10 ENCOUNTER — Ambulatory Visit
Admission: RE | Admit: 2019-05-10 | Discharge: 2019-05-10 | Disposition: A | Discharge status: Home / Self Care | Payer: Medicare HMO | Attending: Internal Medicine | Admitting: Internal Medicine

## 2019-05-10 ENCOUNTER — Ambulatory Visit: Payer: Medicare HMO | Admitting: Anesthesiology

## 2019-05-10 ENCOUNTER — Encounter: Payer: Self-pay | Admitting: Internal Medicine

## 2019-05-10 ENCOUNTER — Encounter
Admission: RE | Disposition: A | Discharge status: Home / Self Care | Payer: Self-pay | Source: Home / Self Care | Attending: Internal Medicine

## 2019-05-10 DIAGNOSIS — Z7901 Long term (current) use of anticoagulants: Secondary | ICD-10-CM | POA: Diagnosis not present

## 2019-05-10 DIAGNOSIS — Z87891 Personal history of nicotine dependence: Secondary | ICD-10-CM | POA: Diagnosis not present

## 2019-05-10 DIAGNOSIS — Z1211 Encounter for screening for malignant neoplasm of colon: Secondary | ICD-10-CM | POA: Diagnosis not present

## 2019-05-10 DIAGNOSIS — E119 Type 2 diabetes mellitus without complications: Secondary | ICD-10-CM | POA: Diagnosis not present

## 2019-05-10 DIAGNOSIS — Z6841 Body Mass Index (BMI) 40.0 and over, adult: Secondary | ICD-10-CM | POA: Insufficient documentation

## 2019-05-10 DIAGNOSIS — K224 Dyskinesia of esophagus: Secondary | ICD-10-CM | POA: Diagnosis not present

## 2019-05-10 DIAGNOSIS — K2970 Gastritis, unspecified, without bleeding: Secondary | ICD-10-CM | POA: Diagnosis not present

## 2019-05-10 DIAGNOSIS — I4891 Unspecified atrial fibrillation: Secondary | ICD-10-CM | POA: Diagnosis not present

## 2019-05-10 DIAGNOSIS — Z7984 Long term (current) use of oral hypoglycemic drugs: Secondary | ICD-10-CM | POA: Diagnosis not present

## 2019-05-10 DIAGNOSIS — K219 Gastro-esophageal reflux disease without esophagitis: Secondary | ICD-10-CM | POA: Insufficient documentation

## 2019-05-10 DIAGNOSIS — G4730 Sleep apnea, unspecified: Secondary | ICD-10-CM | POA: Insufficient documentation

## 2019-05-10 DIAGNOSIS — I10 Essential (primary) hypertension: Secondary | ICD-10-CM | POA: Insufficient documentation

## 2019-05-10 DIAGNOSIS — R1314 Dysphagia, pharyngoesophageal phase: Secondary | ICD-10-CM | POA: Diagnosis not present

## 2019-05-10 DIAGNOSIS — D128 Benign neoplasm of rectum: Secondary | ICD-10-CM | POA: Diagnosis not present

## 2019-05-10 DIAGNOSIS — J45909 Unspecified asthma, uncomplicated: Secondary | ICD-10-CM | POA: Insufficient documentation

## 2019-05-10 DIAGNOSIS — K5730 Diverticulosis of large intestine without perforation or abscess without bleeding: Secondary | ICD-10-CM | POA: Diagnosis not present

## 2019-05-10 DIAGNOSIS — Z888 Allergy status to other drugs, medicaments and biological substances status: Secondary | ICD-10-CM | POA: Diagnosis not present

## 2019-05-10 DIAGNOSIS — Z86010 Personal history of colonic polyps: Secondary | ICD-10-CM | POA: Diagnosis not present

## 2019-05-10 DIAGNOSIS — D122 Benign neoplasm of ascending colon: Secondary | ICD-10-CM | POA: Insufficient documentation

## 2019-05-10 DIAGNOSIS — Z79899 Other long term (current) drug therapy: Secondary | ICD-10-CM | POA: Diagnosis not present

## 2019-05-10 DIAGNOSIS — Q438 Other specified congenital malformations of intestine: Secondary | ICD-10-CM | POA: Diagnosis not present

## 2019-05-10 DIAGNOSIS — K641 Second degree hemorrhoids: Secondary | ICD-10-CM | POA: Insufficient documentation

## 2019-05-10 DIAGNOSIS — N400 Enlarged prostate without lower urinary tract symptoms: Secondary | ICD-10-CM | POA: Insufficient documentation

## 2019-05-10 HISTORY — DX: Gastro-esophageal reflux disease without esophagitis: K21.9

## 2019-05-10 HISTORY — PX: ESOPHAGOGASTRODUODENOSCOPY (EGD) WITH PROPOFOL: SHX5813

## 2019-05-10 HISTORY — DX: Unspecified atrial fibrillation: I48.91

## 2019-05-10 HISTORY — PX: COLONOSCOPY WITH PROPOFOL: SHX5780

## 2019-05-10 LAB — GLUCOSE, CAPILLARY: Glucose-Capillary: 134 mg/dL — ABNORMAL HIGH (ref 70–99)

## 2019-05-10 SURGERY — ESOPHAGOGASTRODUODENOSCOPY (EGD) WITH PROPOFOL
Anesthesia: General

## 2019-05-10 MED ORDER — SODIUM CHLORIDE 0.9 % IV SOLN
INTRAVENOUS | Status: DC
  Administered 2019-05-10: 1000 mL via INTRAVENOUS

## 2019-05-10 MED ORDER — PROPOFOL 10 MG/ML IV BOLUS
INTRAVENOUS | Status: DC | PRN
  Administered 2019-05-10: 30 mg via INTRAVENOUS
  Administered 2019-05-10: 80 mg via INTRAVENOUS
  Administered 2019-05-10: 100 mg via INTRAVENOUS
  Administered 2019-05-10: 30 mg via INTRAVENOUS

## 2019-05-10 MED ORDER — PROPOFOL 500 MG/50ML IV EMUL
INTRAVENOUS | Status: DC | PRN
  Administered 2019-05-10: 125 ug/kg/min via INTRAVENOUS

## 2019-05-10 MED ORDER — LIDOCAINE HCL (PF) 2 % IJ SOLN
INTRAMUSCULAR | Status: AC
  Filled 2019-05-10: qty 10

## 2019-05-10 MED ORDER — PROPOFOL 500 MG/50ML IV EMUL
INTRAVENOUS | Status: AC
  Filled 2019-05-10: qty 50

## 2019-05-10 MED ORDER — LIDOCAINE HCL (CARDIAC) PF 100 MG/5ML IV SOSY
PREFILLED_SYRINGE | INTRAVENOUS | Status: DC | PRN
  Administered 2019-05-10: 40 mg via INTRAVENOUS

## 2019-05-10 NOTE — Op Note (Signed)
[facility]astroenterology Patient Name: Ronnie Cunningham Procedure Date: 05/10/2019 8:52 AM MRN: [id number] Account #: [id number] Date of Birth: [birth year] Admit Type: Outpatient Age: 69 Room: [facility] ENDO ROOM 3 Gender: Male Note Status: Finalized Procedure:             Upper GI endoscopy Indications:           Esophageal dysphagia, Gastro-esophageal reflux disease Providers:             Benay Pike. Marsden Zaino MD, MD Medicines:             Propofol per Anesthesia Complications:         No immediate complications. Estimated blood loss: None. Procedure:             Pre-Anesthesia Assessment:                        - The risks and benefits of the procedure and the                         sedation options and risks were discussed with the                         patient. All questions were answered and informed                         consent was obtained.                        - Patient identification and proposed procedure were                         verified prior to the procedure by the nurse. The                         procedure was verified in the procedure room.                        - ASA Grade Assessment: III - A patient with severe                         systemic disease.                        - After reviewing the risks and benefits, the patient                         was deemed in satisfactory condition to undergo the                         procedure.                        After obtaining informed consent, the endoscope was                         passed under direct vision. Throughout the procedure,                         the patient's blood pressure, pulse, and oxygen  saturations were monitored continuously. The Endoscope                         was introduced through the mouth, and advanced to the                         third part of duodenum. The upper GI endoscopy was                         accomplished without difficulty.  The patient tolerated                         the procedure well. Findings:      Abnormal motility was noted at the gastroesophageal junction. The       cricopharyngeus was abnormal. The distal esophagus/lower esophageal       sphincter is spastic, but gives up passage to the endoscope. The scope       was withdrawn. Dilation was performed with a Maloney dilator with mild       resistance at 84 Fr.      Patchy moderate inflammation characterized by erosions and erythema was       found in the gastric antrum. Biopsies were taken with a cold forceps for       Helicobacter pylori testing.      The cardia and gastric fundus were normal on retroflexion.      The examined duodenum was normal. Impression:            - Abnormal esophageal motility. Dilated.                        - Gastritis. Biopsied.                        - Normal examined duodenum. Recommendation:        - Await pathology results.                        - Monitor results to esophageal dilation                        - Proceed with colonoscopy Procedure Code(s):     --- Professional ---                        [phone], Esophagogastroduodenoscopy, flexible,                         transoral; with biopsy, single or multiple                        43450, Dilation of esophagus, by unguided sound or                         bougie, single or multiple passes Diagnosis Code(s):     --- Professional ---                        K21.9, Gastro-esophageal reflux disease without                         esophagitis  R13.14, Dysphagia, pharyngoesophageal phase                        K29.70, Gastritis, unspecified, without bleeding                        K22.4, Dyskinesia of esophagus CPT copyright 2019 [employer] Association. All rights reserved. The codes documented in this report are preliminary and upon coder review may  be revised to meet current compliance requirements. Efrain Sella MD, MD 05/10/2019  9:09:15 AM This report has been signed electronically. Number of Addenda: 0 Note Initiated On: 05/10/2019 8:52 AM Estimated Blood Loss:  Estimated blood loss: none.      [facility]

## 2019-05-10 NOTE — Anesthesia Postprocedure Evaluation (Signed)
Anesthesia Post Note  Patient: GEDALIA SHIELS  Procedure(s) Performed: ESOPHAGOGASTRODUODENOSCOPY (EGD) WITH PROPOFOL (N/A ) COLONOSCOPY WITH PROPOFOL (N/A )  Patient location during evaluation: Endoscopy Anesthesia Type: General Level of consciousness: awake and alert and oriented Pain management: pain level controlled Vital Signs Assessment: post-procedure vital signs reviewed and stable Respiratory status: spontaneous breathing Cardiovascular status: blood pressure returned to baseline Anesthetic complications: no     Last Vitals:  Vitals:   05/10/19 0938 05/10/19 0958  BP: (!) 107/52 116/66  Pulse: 72   Resp: 12   Temp: 36.8 C   SpO2: 97%     Last Pain:  Vitals:   05/10/19 0958  TempSrc:   PainSc: 0-No pain                 Prudy Candy

## 2019-05-10 NOTE — Transfer of Care (Signed)
Immediate Anesthesia Transfer of Care Note  Patient: Ronnie Cunningham  Procedure(s) Performed: ESOPHAGOGASTRODUODENOSCOPY (EGD) WITH PROPOFOL (N/A ) COLONOSCOPY WITH PROPOFOL (N/A )  Patient Location: PACU and Endoscopy Unit  Anesthesia Type:General  Level of Consciousness: awake  Airway & Oxygen Therapy: Patient Spontanous Breathing  Post-op Assessment: Report given to RN  Post vital signs: stable  Last Vitals:  Vitals Value Taken Time  BP    Temp    Pulse    Resp    SpO2      Last Pain:  Vitals:   05/10/19 0938  TempSrc: Temporal  PainSc: 0-No pain         Complications: No apparent anesthesia complications

## 2019-05-10 NOTE — H&P (Signed)
Outpatient short stay form Pre-procedure 05/10/2019 8:26 AM Ronnie Cunningham K. Ronnie Cunningham, M.D.  Primary Physician: Ronnie Cunningham, M.D.  Reason for visit:  GERD, solid food dysphagia, personal hx of colon polyps  History of present illness:  69 y/o male with hx of GERD presents with intermittent solid food dysphagia for the past 6 months without hemetemesis, vomiting or weight loss. Patient has personal hx of colon polyps (Tubular adenomas) in 2013 on colonoscopy. He has had some recurrent bouts of diverticulitis several weeks ago. Currently asymptomatic.     Current Facility-Administered Medications:  .  0.9 %  sodium chloride infusion, , Intravenous, Continuous, Ronnie Cunningham, Ronnie Pike, MD  Facility-Administered Medications Prior to Admission  Medication Dose Route Frequency Provider Last Rate Last Admin  . ertapenem Ronnie Cunningham LP) injection 1,000 mg  1 g Intramuscular Q24H Cunningham, Ronnie A, PA-C       Medications Prior to Admission  Medication Sig Dispense Refill Last Dose  . atorvastatin (LIPITOR) 20 MG tablet Take 20 mg by mouth at bedtime.    05/09/2019 at Unknown time  . diltiazem (CARDIZEM CD) 240 MG 24 hr capsule Take 1 capsule (240 mg total) by mouth daily. (Patient taking differently: Take 120 mg by mouth daily. ) 60 capsule 2 05/09/2019 at Unknown time  . escitalopram (LEXAPRO) 20 MG tablet Take 20 mg by mouth daily.    05/09/2019 at Unknown time  . finasteride (PROSCAR) 5 MG tablet Take 1 tablet (5 mg total) by mouth daily. 30 tablet 12 05/09/2019 at Unknown time  . metFORMIN (GLUCOPHAGE) 1000 MG tablet Take 1,000 mg by mouth 2 (two) times daily.   05/09/2019 at Unknown time  . metoprolol tartrate (LOPRESSOR) 25 MG tablet Take 1 tablet (25 mg total) by mouth 2 (two) times daily. 60 tablet 2 05/09/2019 at Unknown time  . pantoprazole (PROTONIX) 40 MG tablet Take 40 mg by mouth daily.    05/09/2019 at Unknown time  . tamsulosin (FLOMAX) 0.4 MG CAPS capsule Take 1 capsule (0.4 mg total) by mouth daily after  supper. 30 capsule 12 05/09/2019 at Unknown time  . albuterol (PROAIR HFA) 108 (90 Base) MCG/ACT inhaler Inhale into the lungs.     . Coenzyme Q10 (CO Q 10) 100 MG CAPS Take 2 capsules by mouth daily.      Marland Kitchen ELIQUIS 5 MG TABS tablet SMARTSIG:1 Tablet(s) By Mouth Every 12 Hours   05/07/2019  . fluticasone (FLONASE) 50 MCG/ACT nasal spray Place 2 sprays into both nostrils daily.      Marland Kitchen glucose blood (ONETOUCH ULTRA) test strip USE ONE DAILY     . metFORMIN (GLUCOPHAGE) 500 MG tablet Take 1,000 mg by mouth 2 (two) times daily with a meal.      . montelukast (SINGULAIR) 10 MG tablet Take 10 mg by mouth daily as needed.      . Multiple Vitamin (MULTIVITAMIN WITH MINERALS) TABS tablet Take 1 tablet by mouth daily.     Ronnie Cunningham Delica Lancets [id number] MISC TEST BG 3 TIMES DAILY AS DIRECTED     . traZODone (DESYREL) 50 MG tablet Take 50 mg by mouth at bedtime.        Allergies  Allergen Reactions  . Pollen Extract Other (See Comments)  . Tape Other (See Comments)     Past Medical History:  Diagnosis Date  . Afib ([place])   . Asthma   . BPH (benign prostatic hyperplasia)   . Diabetes mellitus without complication ([place])   . GERD (gastroesophageal reflux disease)   .  Hypertension   . Morbid obesity ([place])   . Seasonal allergies   . Sleep apnea   . Spasmodic dysphonia     Review of systems:  Otherwise negative.    Physical Exam  Gen: Alert, oriented. Appears stated age.  HEENT: Ronnie Cunningham/AT. PERRLA. Lungs: CTA, no wheezes. CV: RR nl S1, S2. Abd: soft, benign, no masses. BS+ Ext: No edema. Pulses 2+    Planned procedures: Proceed with EGD and colonoscopy. The patient understands the nature of the planned procedure, indications, risks, alternatives and potential complications including but not limited to bleeding, infection, perforation, damage to internal organs and possible oversedation/side effects from anesthesia. The patient agrees and gives consent to proceed.  Please refer to procedure notes  for findings, recommendations and patient disposition/instructions.     [name] Ronnie Cunningham, M.D. Gastroenterology 05/10/2019  8:26 AM

## 2019-05-10 NOTE — Anesthesia Preprocedure Evaluation (Signed)
Anesthesia Evaluation  Patient identified by MRN, date of birth, ID band Patient awake    Reviewed: Allergy & Precautions, NPO status , Patient's Chart, lab work & pertinent test results  Airway Mallampati: III       Dental   Pulmonary asthma , sleep apnea , former smoker,    Pulmonary exam normal        Cardiovascular hypertension, Normal cardiovascular exam+ dysrhythmias Atrial Fibrillation      Neuro/Psych  Neuromuscular disease negative psych ROS   GI/Hepatic Neg liver ROS, GERD  ,  Endo/Other  diabetesMorbid obesity  Renal/GU negative Renal ROS  negative genitourinary   Musculoskeletal negative musculoskeletal ROS (+)   Abdominal Normal abdominal exam  (+)   Peds negative pediatric ROS (+)  Hematology negative hematology ROS (+)   Anesthesia Other Findings Past Medical History: No date: Afib ([facility]) No date: Asthma No date: BPH (benign prostatic hyperplasia) No date: Diabetes mellitus without complication ([facility]) No date: GERD (gastroesophageal reflux disease) No date: Hypertension No date: Morbid obesity ([place]) No date: Seasonal allergies No date: Sleep apnea 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  Reproductive/Obstetrics                             Anesthesia Physical Anesthesia Plan  ASA: III  Anesthesia Plan: General   Post-op Pain Management:    Induction: Intravenous  PONV Risk Score and Plan: Propofol infusion  Airway Management Planned: Nasal Cannula  Additional Equipment:   Intra-op Plan:   Post-operative Plan:   Informed Consent: I have reviewed the patients History and Physical, chart, labs and discussed the procedure including the risks, benefits and alternatives for the proposed anesthesia with the patient or authorized representative who has indicated his/her understanding and acceptance.     Dental advisory given  Plan Discussed with: CRNA and  Surgeon  Anesthesia Plan Comments:         Anesthesia Quick Evaluation

## 2019-05-10 NOTE — Op Note (Signed)
[facility] Gastroenterology Patient Name: Ronnie Cunningham Procedure Date: 05/10/2019 8:52 AM MRN: [id number] Account #: [id number] Date of Birth: 1950-03-24 Admit Type: Outpatient Age: 69 Room: [facility] ENDO ROOM 3 Gender: Male Note Status: Finalized Procedure:             Colonoscopy Indications:           High risk colon cancer surveillance: Personal history                         of colonic polyps Providers:             Benay Pike. Corin Formisano MD, MD Medicines:             Propofol per Anesthesia Complications:         No immediate complications. Procedure:             Pre-Anesthesia Assessment:                        - The risks and benefits of the procedure and the                         sedation options and risks were discussed with the                         patient. All questions were answered and informed                         consent was obtained.                        - Patient identification and proposed procedure were                         verified prior to the procedure by the nurse. The                         procedure was verified in the procedure room.                        - ASA Grade Assessment: III - A patient with severe                         systemic disease.                        - After reviewing the risks and benefits, the patient                         was deemed in satisfactory condition to undergo the                         procedure.                        After obtaining informed consent, the colonoscope was                         passed under direct vision. Throughout the procedure,  the patient's blood pressure, pulse, and oxygen                         saturations were monitored continuously. The                         Colonoscope was introduced through the anus and                         advanced to the the cecum, identified by appendiceal                         orifice and ileocecal valve. The  colonoscopy was                         technically difficult and complex due to a redundant                         colon, significant looping and the patient's body                         habitus. Successful completion of the procedure was                         aided by applying abdominal pressure. The patient                         tolerated the procedure well. The quality of the bowel                         preparation was adequate. The ileocecal valve,                         appendiceal orifice, and rectum were photographed. Findings:      The perianal exam findings include internal hemorrhoids that prolapse       with straining, but require manual replacement into the anal canal       (Grade III).      Non-bleeding internal hemorrhoids were found during retroflexion. The       hemorrhoids were Grade II (internal hemorrhoids that prolapse but reduce       spontaneously).      Many medium-mouthed diverticula were found in the sigmoid colon.      A 8 mm polyp was found in the rectum. The polyp was semi-pedunculated.       The polyp was removed with a hot snare. Resection and retrieval were       complete.      A 5 mm polyp was found in the ascending colon. The polyp was sessile.       The polyp was removed with a jumbo cold forceps. Resection and retrieval       were complete.      A 8 mm polyp was found in the ascending colon. The polyp was sessile.       The polyp was removed with a cold snare. Resection and retrieval were       complete.      The exam was otherwise without abnormality. Impression:            - Internal hemorrhoids that prolapse with straining,  but require manual replacement into the anal canal                         (Grade III) found on perianal exam.                        - Non-bleeding internal hemorrhoids.                        - Diverticulosis in the sigmoid colon.                        - One 8 mm polyp in the rectum, removed  with a hot                         snare. Resected and retrieved.                        - One 5 mm polyp in the ascending colon, removed with                         a jumbo cold forceps. Resected and retrieved.                        - One 8 mm polyp in the ascending colon, removed with                         a cold snare. Resected and retrieved.                        - The examination was otherwise normal. Recommendation:        - Patient has a contact number available for                         emergencies. The signs and symptoms of potential                         delayed complications were discussed with the patient.                         Return to normal activities tomorrow. Written                         discharge instructions were provided to the patient.                        - Monitor results to esophageal dilation                        - Await pathology results from EGD, also performed                         today.                        - Resume previous diet.                        - Continue present medications.                        -  Await pathology results.                        - Repeat colonoscopy is recommended for surveillance.                         The colonoscopy date will be determined after                         pathology results from today's exam become available                         for review.                        - Return to GI office in 6 weeks.                        - Follow up with Octavia Bruckner, PA-C in [ ]  months. Procedure Code(s):     --- Professional ---                        [phone], Colonoscopy, flexible; with removal of                         tumor(s), polyp(s), or other lesion(s) by snare                         technique                        45380, 64, Colonoscopy, flexible; with biopsy, single                         or multiple Diagnosis Code(s):     --- Professional ---                        K57.30, Diverticulosis of  large intestine without                         perforation or abscess without bleeding                        K63.5, Polyp of colon                        K62.1, Rectal polyp                        K64.2, Third degree hemorrhoids                        Z86.010, Personal history of colonic polyps CPT copyright 2019 [employer]. All rights reserved. The codes documented in this report are preliminary and upon coder review may  be revised to meet current compliance requirements. Efrain Sella MD, MD 05/10/2019 9:40:10 AM This report has been signed electronically. Number of Addenda: 0 Note Initiated On: 05/10/2019 8:52 AM Scope Withdrawal Time: 0 hours 6 minutes 12 seconds  Total Procedure Duration: 0 hours 23 minutes 5 seconds  Estimated Blood Loss:  Estimated blood loss: none.  [facility]

## 2019-05-10 NOTE — Interval H&P Note (Signed)
History and Physical Interval Note:  05/10/2019 9:00 AM  Ronnie Cunningham  has presented today for surgery, with the diagnosis of DYSPHAGIA ,GERD ,PERS HX.OF COLON POLYPS.  The various methods of treatment have been discussed with the patient and family. After consideration of risks, benefits and other options for treatment, the patient has consented to  Procedure(s): ESOPHAGOGASTRODUODENOSCOPY (EGD) WITH PROPOFOL (N/A) COLONOSCOPY WITH PROPOFOL (N/A) as a surgical intervention.  The patient's history has been reviewed, patient examined, no change in status, stable for surgery.  I have reviewed the patient's chart and labs.  Questions were answered to the patient's satisfaction.     [place], [place]

## 2019-05-11 ENCOUNTER — Encounter: Payer: Self-pay | Admitting: *Deleted

## 2019-05-11 LAB — SURGICAL PATHOLOGY

## 2019-08-25 ENCOUNTER — Encounter: Payer: Self-pay | Admitting: Emergency Medicine

## 2019-08-25 ENCOUNTER — Other Ambulatory Visit: Payer: Self-pay

## 2019-08-25 ENCOUNTER — Ambulatory Visit
Admission: EM | Admit: 2019-08-25 | Discharge: 2019-08-25 | Disposition: A | Discharge status: Home / Self Care | Payer: Medicare HMO | Attending: Emergency Medicine | Admitting: Emergency Medicine

## 2019-08-25 DIAGNOSIS — R1032 Left lower quadrant pain: Secondary | ICD-10-CM

## 2019-08-25 DIAGNOSIS — J0141 Acute recurrent pansinusitis: Secondary | ICD-10-CM | POA: Diagnosis not present

## 2019-08-25 DIAGNOSIS — E7800 Pure hypercholesterolemia, unspecified: Secondary | ICD-10-CM | POA: Insufficient documentation

## 2019-08-25 DIAGNOSIS — Z7901 Long term (current) use of anticoagulants: Secondary | ICD-10-CM | POA: Insufficient documentation

## 2019-08-25 DIAGNOSIS — Z6841 Body Mass Index (BMI) 40.0 and over, adult: Secondary | ICD-10-CM | POA: Insufficient documentation

## 2019-08-25 DIAGNOSIS — J45909 Unspecified asthma, uncomplicated: Secondary | ICD-10-CM | POA: Diagnosis not present

## 2019-08-25 DIAGNOSIS — Z79899 Other long term (current) drug therapy: Secondary | ICD-10-CM | POA: Diagnosis not present

## 2019-08-25 DIAGNOSIS — E119 Type 2 diabetes mellitus without complications: Secondary | ICD-10-CM | POA: Insufficient documentation

## 2019-08-25 DIAGNOSIS — Z20822 Contact with and (suspected) exposure to covid-19: Secondary | ICD-10-CM | POA: Insufficient documentation

## 2019-08-25 DIAGNOSIS — R140 Abdominal distension (gaseous): Secondary | ICD-10-CM | POA: Insufficient documentation

## 2019-08-25 DIAGNOSIS — Z7984 Long term (current) use of oral hypoglycemic drugs: Secondary | ICD-10-CM | POA: Diagnosis not present

## 2019-08-25 DIAGNOSIS — N5082 Scrotal pain: Secondary | ICD-10-CM | POA: Insufficient documentation

## 2019-08-25 DIAGNOSIS — K219 Gastro-esophageal reflux disease without esophagitis: Secondary | ICD-10-CM | POA: Insufficient documentation

## 2019-08-25 DIAGNOSIS — I10 Essential (primary) hypertension: Secondary | ICD-10-CM | POA: Insufficient documentation

## 2019-08-25 DIAGNOSIS — I4891 Unspecified atrial fibrillation: Secondary | ICD-10-CM | POA: Insufficient documentation

## 2019-08-25 DIAGNOSIS — Z87440 Personal history of urinary (tract) infections: Secondary | ICD-10-CM | POA: Insufficient documentation

## 2019-08-25 DIAGNOSIS — Z87891 Personal history of nicotine dependence: Secondary | ICD-10-CM | POA: Insufficient documentation

## 2019-08-25 LAB — URINALYSIS, COMPLETE (UACMP) WITH MICROSCOPIC
Bilirubin Urine: NEGATIVE
Glucose, UA: NEGATIVE mg/dL
Hgb urine dipstick: NEGATIVE
Ketones, ur: NEGATIVE mg/dL
Leukocytes,Ua: NEGATIVE
Nitrite: NEGATIVE
Protein, ur: NEGATIVE mg/dL
RBC / HPF: NONE SEEN RBC/hpf (ref 0–5)
Specific Gravity, Urine: 1.015 (ref 1.005–1.030)
pH: 7 (ref 5.0–8.0)

## 2019-08-25 LAB — GROUP A STREP BY PCR: Group A Strep by PCR: NOT DETECTED

## 2019-08-25 MED ORDER — AMOXICILLIN-POT CLAVULANATE 875-125 MG PO TABS: 1.0000 | ORAL_TABLET | Freq: Three times a day (TID) | ORAL | 0 refills | Status: AC

## 2019-08-25 NOTE — Discharge Instructions (Addendum)
discontinue the Claritin, start Mucinex.  Continue Flonase, start saline nasal irrigation with a Milta Deiters med rinse and distilled water as often as you want to wash infection out of your sinuses.  Finish the Augmentin, even if you feel better.  This will also treat her diverticulitis.  We will contact you if urine culture grows back and urinary tract infection that needs different antibiotics.  May take 1000 mg of Tylenol 3-4 times a day as needed for abdominal pain.  Immediately to the ER for fevers above 100.4, pain not controlled with Tylenol, blood in your stool or urine, or any other concerns.

## 2019-08-25 NOTE — ED Triage Notes (Signed)
Patient c/o lower abdominal pain that started 2 weeks ago.  Patient also c/o nasal drainage that started 2 weeks ago along with a sore throat.  Denies fever.

## 2019-08-25 NOTE — ED Provider Notes (Signed)
HPI  SUBJECTIVE:  Ronnie Cunningham is a 69 y.o. male who presents with 2 issues: First, he reports 2 half to 3 weeks of sore throat, ear pain, nasal congestion, postnasal drip, yellowish-green rhinorrhea, sinus pain and pressure, upper dental pain, cough secondary to postnasal drip. He states this started off with allergy symptoms. He denies facial swelling. He tried some leftover amoxicillin 1000 mg twice daily for the past 2 days. He states that his symptoms were triggered by ragweed. He tried Claritin, Singulair, Flonase and a leftover amoxicillin. He states he leftover amoxicillin seems to be helping. No aggravating factors. No antipyretic in the past 4 to 6 h.  Second, he reports intermittent low midline abdominal soreness radiating to his bilateral lower back for the past 2-1/2 to 3 weeks, that has become constant over the past 4 to 5 days. He reports 3 days of intermittent loose stools. He had one episode of bright red blood per rectum which he attributes to internal hemorrhoids, states that this has resolved. He reports dysuria, cloudy and odorous urine which has resolved. Had a normal bowel movement yesterday. No anorexia. States that he feels bloated. No nausea, vomiting, frequency, urinary urgency, frequency, hematuria. No penile rash, discharge, testicular pain or swelling, scrotal swelling. He reports left-sided groin/scrotal pain, but states it has not changed above baseline. He has been worked up for this and is was found to have a direct inguinal hernia which includes a very small portion of the left side of the bladder on CT is status post colonoscopy on March 21 which found internal hemorrhoids. He states that he has had symptoms like this before, was found to have diverticulitis. It was not a UTI.  Patient has a past medical history of atrial fibrillation on Eliquis, ESBL Klebsiella UTI treated with IV Invanz in December 2020, diabetes, hypertension, hypercholesterolemia, orchitis,  epididymitis, BPH, frequent sinusitis, diabetes, hypertension, nonobstructing nephrolithiasis. No history of abdominal surgeries, chronic kidney disease. .  GEZ:MOQHU, Cherlyn Labella, MD  Urology: [facility]   Past Medical History:  Diagnosis Date  . Afib ([place])   . Asthma   . BPH (benign prostatic hyperplasia)   . Diabetes mellitus without complication ([place])   . GERD (gastroesophageal reflux disease)   . Hypertension   . Morbid obesity ([place])   . Seasonal allergies   . Sleep apnea   . Spasmodic dysphonia     Past Surgical History:  Procedure Laterality Date  . ADENOIDECTOMY    . CARDIAC CATHETERIZATION    . CARDIAC SURGERY    . COLONOSCOPY WITH PROPOFOL N/A 05/10/2019   Procedure: COLONOSCOPY WITH PROPOFOL;  Surgeon: Toledo, Benay Pike, MD;  Location: [facility] ENDOSCOPY;  Service: Gastroenterology;  Laterality: N/A;  . ESOPHAGOGASTRODUODENOSCOPY (EGD) WITH PROPOFOL N/A 05/10/2019   Procedure: ESOPHAGOGASTRODUODENOSCOPY (EGD) WITH PROPOFOL;  Surgeon: Toledo, Benay Pike, MD;  Location: [facility] ENDOSCOPY;  Service: Gastroenterology;  Laterality: N/A;  . TONSILLECTOMY      Family History  Problem Relation Age of Onset  . Cancer Mother   . Hypertension Mother   . Cancer Father   . Hypertension Father     Social History   Tobacco Use  . Smoking status: Former Research scientist (life sciences)  . Smokeless tobacco: Never Used  Vaping Use  . Vaping Use: Never used  Substance Use Topics  . Alcohol use: Yes    Alcohol/week: 0.0 standard drinks  . Drug use: No    No current facility-administered medications for this encounter.  Current Outpatient Medications:  .  albuterol (  PROAIR HFA) 108 (90 Base) MCG/ACT inhaler, Inhale into the lungs., Disp: , Rfl:  .  atorvastatin (LIPITOR) 20 MG tablet, Take 20 mg by mouth at bedtime. , Disp: , Rfl:  .  Coenzyme Q10 (CO Q 10) 100 MG CAPS, Take 2 capsules by mouth daily. , Disp: , Rfl:  .  diltiazem (CARDIZEM CD) 240 MG 24 hr capsule, Take 1 capsule (240 mg total) by mouth  daily. (Patient taking differently: Take 120 mg by mouth daily. ), Disp: 60 capsule, Rfl: 2 .  ELIQUIS 5 MG TABS tablet, SMARTSIG:1 Tablet(s) By Mouth Every 12 Hours, Disp: , Rfl:  .  escitalopram (LEXAPRO) 20 MG tablet, Take 20 mg by mouth daily. , Disp: , Rfl:  .  finasteride (PROSCAR) 5 MG tablet, Take 1 tablet (5 mg total) by mouth daily., Disp: 30 tablet, Rfl: 12 .  fluticasone (FLONASE) 50 MCG/ACT nasal spray, Place 2 sprays into both nostrils daily. , Disp: , Rfl:  .  glucose blood (ONETOUCH ULTRA) test strip, USE ONE DAILY, Disp: , Rfl:  .  metFORMIN (GLUCOPHAGE) 1000 MG tablet, Take 1,000 mg by mouth 2 (two) times daily., Disp: , Rfl:  .  metoprolol tartrate (LOPRESSOR) 25 MG tablet, Take 1 tablet (25 mg total) by mouth 2 (two) times daily., Disp: 60 tablet, Rfl: 2 .  montelukast (SINGULAIR) 10 MG tablet, Take 10 mg by mouth daily as needed. , Disp: , Rfl:  .  Multiple Vitamin (MULTIVITAMIN WITH MINERALS) TABS tablet, Take 1 tablet by mouth daily., Disp: , Rfl:  .  OneTouch Delica Lancets 75F MISC, TEST BG 3 TIMES DAILY AS DIRECTED, Disp: , Rfl:  .  pantoprazole (PROTONIX) 40 MG tablet, Take 40 mg by mouth daily. , Disp: , Rfl:  .  tamsulosin (FLOMAX) 0.4 MG CAPS capsule, Take 1 capsule (0.4 mg total) by mouth daily after supper., Disp: 30 capsule, Rfl: 12 .  traZODone (DESYREL) 50 MG tablet, Take 50 mg by mouth at bedtime., Disp: , Rfl:  .  amoxicillin-clavulanate (AUGMENTIN) 875-125 MG tablet, Take 1 tablet by mouth 3 (three) times daily for 7 days., Disp: 21 tablet, Rfl: 0  Allergies  Allergen Reactions  . Pollen Extract Other (See Comments)  . Tape Other (See Comments)     ROS  As noted in HPI.   Physical Exam  BP (!) 158/80 (BP Location: Right Arm)   Pulse (!) 55   Temp 98 F (36.7 C) (Oral)   Resp 18   Ht 5\' 11"  (1.803 m)   Wt 136.1 kg   SpO2 100%   BMI 41.84 kg/m   Constitutional: Well developed, well nourished, no acute distress Eyes:  EOMI, conjunctiva  normal bilaterally HENT: Normocephalic, atraumatic,mucus membranes moist. Purulent nasal congestion. Erythematous, swollen turbinates.  Positive maxillary and sinus tenderness. Positive PND.  Respiratory: Normal inspiratory effort, lungs clear bilaterally Cardiovascular: Normal rate regular rhythm, no murmurs rubs or gallops GI: Obese.  Large umbilical reducible nontender hernia, otherwise normal appearance.  Active bowel sounds.  Diffuse mild tenderness maximal in the left side, left lower quadrant, suprapubic region.  No distention, guarding, rebound.  Negative Murphy.  Negative McBurney.  Moving around the table comfortably. Back: Questionable left CVAT GU: Uncircumcised male.  Testes descended bilaterally.  Bilateral testicles and epididymis nontender.  Positive tenderness in the left inguinal region.  Patient states this is not new or different.  No appreciable inguinal hernia.  Patient declined chaperone.  No perineal tenderness, erythema, swelling. Rectal: Normal external appearance.  Prostate normal, nontender, not boggy. skin: No rash, skin intact Musculoskeletal: no deformities Neurologic: Alert & oriented x 3, no focal neuro deficits Psychiatric: Speech and behavior appropriate   ED Course   Medications - No data to display  Orders Placed This Encounter  Procedures  . SARS CORONAVIRUS 2 (TAT 6-24 HRS) Nasopharyngeal Nasopharyngeal Swab    Standing Status:   Standing    Number of Occurrences:   1    Order Specific Question:   Is this test for diagnosis or screening    Answer:   Diagnosis of ill patient    Order Specific Question:   Symptomatic for COVID-19 as defined by CDC    Answer:   Yes    Order Specific Question:   Date of Symptom Onset    Answer:   08/11/2019    Order Specific Question:   Hospitalized for COVID-19    Answer:   No    Order Specific Question:   Admitted to ICU for COVID-19    Answer:   No    Order Specific Question:   Previously tested for COVID-19     Answer:   Yes    Order Specific Question:   Resident in a congregate (group) care setting    Answer:   No    Order Specific Question:   Employed in healthcare setting    Answer:   No    Order Specific Question:   Has patient completed COVID vaccination(s) (2 doses of Pfizer/Moderna 1 dose of [employer])    Answer:   No  . Group A Strep by PCR    Standing Status:   Standing    Number of Occurrences:   1    Order Specific Question:   Patient immune status    Answer:   Normal  . Urine culture    Standing Status:   Standing    Number of Occurrences:   1  . Urinalysis, Complete w Microscopic    Standing Status:   Standing    Number of Occurrences:   1    No results found for this or any previous visit (from the past 24 hour(s)). No results found.  ED Clinical Impression  1. Acute recurrent pansinusitis   2. Left lower quadrant abdominal pain      ED Assessment/Plan  Outside records, lab reviewed.  As noted in HPI.  1.  Patient with sinusitis.  We will have him discontinue the Claritin, since it is not working, and start Mucinex.  Continue Flonase, start saline nasal irrigation.  Given duration of symptoms, will start on Augmentin.  He is to follow-up with his primary care physician if this is not getting better.  2.  Abdominal pain. his urine is negative for UTI although he has rare bacteria.  Will send this off for culture prior to initiating treatment for UTI.    Pt abd exam is benign, no peritoneal signs. No evidence of surgical abd. Doubt SBO, mesenteric ischemia, appendicitis, hepatitis, cholecystitis, pancreatitis, or perforated viscus. No evidence to suggest testicular, epididymal source for abdominal pain.  No evidence of prostatitis.  He has tenderness in his left inguinal region, but this is not new.  Doubt incarcerated hernia.  He has left flank, left lower quadrant and suprapubic tenderness.  Questionable CVA tenderness, but doubt pyelonephritis particularly in the  absence of fevers, absence of nitrites/leukocytes/pyuria in the UA, or other systemic symptoms.  Discussed with patient that it was difficult to pinpoint the exact  cause of his symptoms based on a UA and his physical exam, however he states this is very similar to previous episodes of diverticulitis, so we will treat as such with  Augmentin 3 times daily for 7 days which will also cover sinusitis.  He states that he will contact his urologist and follow-up with them within the next week.  will contact patient if his urine culture grows out a urinary tract infection that requires different antibiotics.  Gave him strict ER return precautions.  Discussed labs,  MDM, treatment plan, and plan for follow-up with patient. Discussed sn/sx that should prompt return to the ED. patient agrees with plan.   Meds ordered this encounter  Medications  . amoxicillin-clavulanate (AUGMENTIN) 875-125 MG tablet    Sig: Take 1 tablet by mouth 3 (three) times daily for 7 days.    Dispense:  21 tablet    Refill:  0    *This clinic note was created using [employer]. Therefore, there may be occasional mistakes despite careful proofreading.   ?    Melynda Ripple, MD 08/27/19 [id number]

## 2019-08-26 LAB — SARS CORONAVIRUS 2 (TAT 6-24 HRS): SARS Coronavirus 2: NEGATIVE

## 2019-08-27 LAB — URINE CULTURE: Culture: NO GROWTH

## 2020-02-01 ENCOUNTER — Other Ambulatory Visit: Payer: Medicare HMO

## 2020-02-02 ENCOUNTER — Encounter: Payer: Self-pay | Admitting: Urology

## 2020-02-10 ENCOUNTER — Other Ambulatory Visit: Payer: Self-pay

## 2020-02-10 ENCOUNTER — Encounter: Payer: Self-pay | Admitting: Urology

## 2020-02-10 ENCOUNTER — Ambulatory Visit: Payer: Medicare HMO | Admitting: Urology

## 2020-02-10 VITALS — BP 146/74 | HR 79 | Ht 71.0 in | Wt 305.0 lb

## 2020-02-10 DIAGNOSIS — N138 Other obstructive and reflux uropathy: Secondary | ICD-10-CM

## 2020-02-10 DIAGNOSIS — R102 Pelvic and perineal pain: Secondary | ICD-10-CM

## 2020-02-10 DIAGNOSIS — N5082 Scrotal pain: Secondary | ICD-10-CM

## 2020-02-10 DIAGNOSIS — N390 Urinary tract infection, site not specified: Secondary | ICD-10-CM

## 2020-02-10 DIAGNOSIS — N401 Enlarged prostate with lower urinary tract symptoms: Secondary | ICD-10-CM | POA: Diagnosis not present

## 2020-02-10 LAB — BLADDER SCAN AMB NON-IMAGING: Scan Result: 0

## 2020-02-10 NOTE — Progress Notes (Signed)
   02/10/2020 3:08 PM   Ronnie Cunningham 17-Dec-1950 [id number]  Reason for visit: Follow up BPH, PSA screening, recurrent UTIs  HPI: I saw Ronnie Cunningham back in urology clinic for follow-up of the above issues.  He is a 69 year old male with a family history of lethal prostate cancer, and urinary symptoms well managed on Flomax and finasteride.  Prostate measures 31 g on prior CT.  He did have a multidrug-resistant Klebsiella UTI in December 2020 that required IV antibiotic infusions.  Urine culture in June 2021 was negative.  Had a prior UTI in 2018 as well.  PSA remains stable at 0.23 from 0.31 last year. Corrected for finasteride PSA equals 0.46.  Urinalysis benign today with 0-5 WBCs, 0-2 RBCs, no bacteria, nitrite negative, no leukocytes.  His primary complaint is some mild urinary hesitancy, but overall he is minimally bothered by his urinary symptoms.  IPSS score today is 13, with quality of life mixed.  He denies any gross hematuria or dysuria.  He also has had some left-sided scrotal pain.  On exam he has a patent meatus without lesions, testicles 20 cc and descended bilaterally, left epididymal tenderness but no definite masses.  Will order scrotal ultrasound for scrotal pain.  We also reviewed consideration of cystoscopy with his history of UTI and urinary symptoms despite maximal medical therapy.  He would like to hold off for now, but will think about it prior to her next visit.  With his small 30 g prostate on recent CT, he would be a candidate for UroLift as well.  RTC 6 months IPSS and PVR, sooner if UTIs or problems-consider cystoscopy in the future Will call with scrotal ultrasound results regarding scrotal pain   [name], MD  [facility] [address], [place], [place] [address] [phone]

## 2020-02-10 NOTE — Patient Instructions (Signed)
Cystoscopy Cystoscopy is a procedure that is used to help diagnose and sometimes treat conditions that affect the lower urinary tract. The lower urinary tract includes the bladder and the urethra. The urethra is the tube that drains urine from the bladder. Cystoscopy is done using a thin, tube-shaped instrument with a light and camera at the end (cystoscope). The cystoscope may be hard or flexible, depending on the goal of the procedure. The cystoscope is inserted through the urethra, into the bladder. Cystoscopy may be recommended if you have:  Urinary tract infections that keep coming back.  Blood in the urine (hematuria).  An inability to control when you urinate (urinary incontinence) or an overactive bladder.  Unusual cells found in a urine sample.  A blockage in the urethra, such as a urinary stone.  Painful urination.  An abnormality in the bladder found during an intravenous pyelogram (IVP) or CT scan. Cystoscopy may also be done to remove a sample of tissue to be examined under a microscope (biopsy). What are the risks? Generally, this is a safe procedure. However, problems may occur, including:  Infection.  Bleeding.  What happens during the procedure?  1. You will be given one or more of the following: ? A medicine to numb the area (local anesthetic). 2. The area around the opening of your urethra will be cleaned. 3. The cystoscope will be passed through your urethra into your bladder. 4. Germ-free (sterile) fluid will flow through the cystoscope to fill your bladder. The fluid will stretch your bladder so that your health care provider can clearly examine your bladder walls. 5. Your doctor will look at the urethra and bladder. 6. The cystoscope will be removed The procedure may vary among health care providers  What can I expect after the procedure? After the procedure, it is common to have: 1. Some soreness or pain in your abdomen and urethra. 2. Urinary symptoms.  These include: ? Mild pain or burning when you urinate. Pain should stop within a few minutes after you urinate. This may last for up to 1 week. ? A small amount of blood in your urine for several days. ? Feeling like you need to urinate but producing only a small amount of urine. Follow these instructions at home: General instructions  Return to your normal activities as told by your health care provider.   Do not drive for 24 hours if you were given a sedative during your procedure.  Watch for any blood in your urine. If the amount of blood in your urine increases, call your health care provider.  If a tissue sample was removed for testing (biopsy) during your procedure, it is up to you to get your test results. Ask your health care provider, or the department that is doing the test, when your results will be ready.  Drink enough fluid to keep your urine pale yellow.  Keep all follow-up visits as told by your health care provider. This is important. Contact a health care provider if you:  Have pain that gets worse or does not get better with medicine, especially pain when you urinate.  Have trouble urinating.  Have more blood in your urine. Get help right away if you:  Have blood clots in your urine.  Have abdominal pain.  Have a fever or chills.  Are unable to urinate. Summary  Cystoscopy is a procedure that is used to help diagnose and sometimes treat conditions that affect the lower urinary tract.  Cystoscopy is done using   a thin, tube-shaped instrument with a light and camera at the end.  After the procedure, it is common to have some soreness or pain in your abdomen and urethra.  Watch for any blood in your urine. If the amount of blood in your urine increases, call your health care provider.  If you were prescribed an antibiotic medicine, take it as told by your health care provider. Do not stop taking the antibiotic even if you start to feel better. This  information is not intended to replace advice given to you by your health care provider. Make sure you discuss any questions you have with your health care provider. Document Revised: 02/10/2018 Document Reviewed: 02/10/2018 Elsevier Patient Education  2020 [employer]   

## 2020-02-11 LAB — URINALYSIS, COMPLETE
Bilirubin, UA: NEGATIVE
Glucose, UA: NEGATIVE
Ketones, UA: NEGATIVE
Leukocytes,UA: NEGATIVE
Nitrite, UA: NEGATIVE
Protein,UA: NEGATIVE
RBC, UA: NEGATIVE
Specific Gravity, UA: 1.015 (ref 1.005–1.030)
Urobilinogen, Ur: 0.2 mg/dL (ref 0.2–1.0)
pH, UA: 6 (ref 5.0–7.5)

## 2020-02-11 LAB — MICROSCOPIC EXAMINATION: Bacteria, UA: NONE SEEN

## 2020-03-01 ENCOUNTER — Ambulatory Visit
Admission: RE | Admit: 2020-03-01 | Discharge: 2020-03-01 | Disposition: A | Discharge status: Home / Self Care | Payer: Medicare HMO | Source: Ambulatory Visit | Attending: Urology | Admitting: Urology

## 2020-03-01 ENCOUNTER — Other Ambulatory Visit: Payer: Self-pay

## 2020-03-01 DIAGNOSIS — N5082 Scrotal pain: Secondary | ICD-10-CM | POA: Insufficient documentation

## 2020-03-02 ENCOUNTER — Telehealth: Payer: Self-pay

## 2020-03-02 NOTE — Telephone Encounter (Signed)
-----   Message from Sondra Come, MD sent at 03/02/2020  8:38 AM EST ----- Scrotal ultrasound normal.  Recommend snug fitting underwear and icing for any recurrent scrotal pain.  Follow-up as scheduled  Legrand Rams, MD 03/02/2020

## 2020-03-02 NOTE — Telephone Encounter (Signed)
Called pt informed him of the information below. Pt gave verbal understanding.  

## 2020-04-17 ENCOUNTER — Other Ambulatory Visit: Payer: Self-pay | Admitting: Urology

## 2020-08-10 ENCOUNTER — Ambulatory Visit: Payer: Self-pay | Admitting: Urology

## 2020-11-14 ENCOUNTER — Other Ambulatory Visit: Payer: Self-pay

## 2020-11-14 ENCOUNTER — Encounter: Payer: Self-pay | Admitting: Emergency Medicine

## 2020-11-14 ENCOUNTER — Emergency Department
Admission: EM | Admit: 2020-11-14 | Discharge: 2020-11-14 | Disposition: A | Discharge status: Home / Self Care | Payer: Medicare HMO | Attending: Student in an Organized Health Care Education/Training Program | Admitting: Student in an Organized Health Care Education/Training Program

## 2020-11-14 DIAGNOSIS — Z79899 Other long term (current) drug therapy: Secondary | ICD-10-CM | POA: Diagnosis not present

## 2020-11-14 DIAGNOSIS — Z203 Contact with and (suspected) exposure to rabies: Secondary | ICD-10-CM | POA: Diagnosis present

## 2020-11-14 DIAGNOSIS — S61431A Puncture wound without foreign body of right hand, initial encounter: Secondary | ICD-10-CM | POA: Diagnosis present

## 2020-11-14 DIAGNOSIS — W5501XA Bitten by cat, initial encounter: Secondary | ICD-10-CM | POA: Insufficient documentation

## 2020-11-14 DIAGNOSIS — Z7984 Long term (current) use of oral hypoglycemic drugs: Secondary | ICD-10-CM | POA: Insufficient documentation

## 2020-11-14 DIAGNOSIS — Z2914 Encounter for prophylactic rabies immune globin: Secondary | ICD-10-CM | POA: Diagnosis not present

## 2020-11-14 DIAGNOSIS — Z87891 Personal history of nicotine dependence: Secondary | ICD-10-CM | POA: Diagnosis not present

## 2020-11-14 DIAGNOSIS — J45909 Unspecified asthma, uncomplicated: Secondary | ICD-10-CM | POA: Diagnosis not present

## 2020-11-14 DIAGNOSIS — Z23 Encounter for immunization: Secondary | ICD-10-CM | POA: Insufficient documentation

## 2020-11-14 DIAGNOSIS — I10 Essential (primary) hypertension: Secondary | ICD-10-CM | POA: Diagnosis not present

## 2020-11-14 DIAGNOSIS — Z7951 Long term (current) use of inhaled steroids: Secondary | ICD-10-CM | POA: Diagnosis not present

## 2020-11-14 DIAGNOSIS — Z7901 Long term (current) use of anticoagulants: Secondary | ICD-10-CM | POA: Diagnosis not present

## 2020-11-14 DIAGNOSIS — E119 Type 2 diabetes mellitus without complications: Secondary | ICD-10-CM | POA: Diagnosis not present

## 2020-11-14 MED ORDER — AMOXICILLIN-POT CLAVULANATE 875-125 MG PO TABS: 1.0000 | ORAL_TABLET | Freq: Two times a day (BID) | ORAL | 0 refills | Status: AC

## 2020-11-14 MED ORDER — TETANUS-DIPHTH-ACELL PERTUSSIS 5-2.5-18.5 LF-MCG/0.5 IM SUSY
0.5000 mL | PREFILLED_SYRINGE | Freq: Once | INTRAMUSCULAR | Status: AC
  Administered 2020-11-14: 0.5 mL via INTRAMUSCULAR
  Filled 2020-11-14: qty 0.5

## 2020-11-14 MED ORDER — RABIES VACCINE, PCEC IM SUSR
1.0000 mL | Freq: Once | INTRAMUSCULAR | Status: AC
  Administered 2020-11-14: 1 mL via INTRAMUSCULAR
  Filled 2020-11-14: qty 1

## 2020-11-14 NOTE — ED Notes (Signed)
See triage note  was bitten by stray cat    states he has been feeding this cat for a while  went to feed the cat and was distracted by a wasp  pulled his hand back fast

## 2020-11-14 NOTE — ED Provider Notes (Signed)
[facility] Emergency Department Provider Note  ____________________________________________  Time seen: Approximately 6:59 PM  I have reviewed the triage vital signs and the nursing notes.   HISTORY  Chief Complaint Animal Bite    HPI Ronnie Cunningham is a 70 y.o. male who presents the emergency department complaining of cat bite to the right hand.  Patient was tending to take care of a stray cat when some yellow jackets presented.  Patient became startled, jerked back causing her To become startled.  Cat accidentally bit his hand and created 2 very superficial punctures to the right hand.  Full range of motion.  Patient thoroughly cleansed the area prior to arrival.  Patient has had rabies vaccine series before.  Unsure last tetanus shot.  Medical history as described below no complaints of chronic medical problems.       Past Medical History:  Diagnosis Date   Afib ([place])    Asthma    BPH (benign prostatic hyperplasia)    Diabetes mellitus without complication ([place])    GERD (gastroesophageal reflux disease)    Hypertension    Morbid obesity ([place])    Seasonal allergies    Sleep apnea    Spasmodic dysphonia     Patient Active Problem List   Diagnosis Date Noted   Atrial fibrillation with RVR ([place]) 10/28/2016   UTI (urinary tract infection) 10/26/2016   Morbid (severe) obesity due to excess calories ([place]) 04/12/2015   Morbid obesity ([place]) 04/12/2015   Borderline diabetes mellitus 02/22/2015   BP (high blood pressure) 02/16/2014   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   Abnormal blood sugar 09/15/2013   Chronic nonalcoholic liver disease 0000000   Essential (primary) hypertension 09/15/2013   Combined fat and carbohydrate induced hyperlipemia 09/15/2013   H/O neoplasm 10/27/2012   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   Obstructive apnea 02/21/2012   Controlled type 2 diabetes mellitus without complication ([place]) [id number]   Type 2 diabetes mellitus ([place])  10/03/2011   Acute antritis 07/23/2011   Cough 07/23/2011   HLD (hyperlipidemia) 07/23/2011   Difficult or painful urination 06/11/2011   Actinic keratoses 06/01/2010    Past Surgical History:  Procedure Laterality Date   ADENOIDECTOMY     CARDIAC CATHETERIZATION     CARDIAC SURGERY     COLONOSCOPY WITH PROPOFOL N/A 05/10/2019   Procedure: COLONOSCOPY WITH PROPOFOL;  Surgeon: Toledo, Benay Pike, MD;  Location: [facility] ENDOSCOPY;  Service: Gastroenterology;  Laterality: N/A;   ESOPHAGOGASTRODUODENOSCOPY (EGD) WITH PROPOFOL N/A 05/10/2019   Procedure: ESOPHAGOGASTRODUODENOSCOPY (EGD) WITH PROPOFOL;  Surgeon: Toledo, Benay Pike, MD;  Location: [facility] ENDOSCOPY;  Service: Gastroenterology;  Laterality: N/A;   TONSILLECTOMY      Prior to Admission medications   Medication Sig Start Date End Date Taking? Authorizing Provider  amoxicillin-clavulanate (AUGMENTIN) 875-125 MG tablet Take 1 tablet by mouth 2 (two) times daily for 7 days. 11/14/20 11/21/20 Yes Leiliana Foody, Charline Bills, PA-C  albuterol (VENTOLIN HFA) 108 (90 Base) MCG/ACT inhaler Inhale into the lungs. 11/12/11   [provider]  atorvastatin (LIPITOR) 20 MG tablet Take 20 mg by mouth at bedtime.  04/01/12   [provider]  Coenzyme Q10 (CO Q 10) 100 MG CAPS Take 2 capsules by mouth daily.     [provider]  diltiazem (CARDIZEM CD) 240 MG 24 hr capsule Take 1 capsule (240 mg total) by mouth daily. 10/30/16   Gladstone Lighter, MD  ELIQUIS 5 MG TABS tablet SMARTSIG:1 Tablet(s) By Mouth Every 12 Hours 02/22/19   [provider]  escitalopram (LEXAPRO) 20 MG tablet Take 20 mg by mouth daily.  04/05/14   [provider]  finasteride (PROSCAR) 5 MG tablet Take 1 tablet (5 mg total) by mouth daily. 02/08/19   [name], MD  fluticasone (FLONASE) 50 MCG/ACT nasal spray Place 2 sprays into both nostrils daily.  12/08/14   [provider]  glucose blood (ONETOUCH ULTRA) test strip USE ONE DAILY  07/06/18   [provider]  metFORMIN (GLUCOPHAGE) 1000 MG tablet Take 1,000 mg by mouth 2 (two) times daily. 03/18/19   [provider]  metoprolol tartrate (LOPRESSOR) 25 MG tablet Take 1 tablet (25 mg total) by mouth 2 (two) times daily. 10/29/16   Gladstone Lighter, MD  montelukast (SINGULAIR) 10 MG tablet Take 10 mg by mouth daily as needed.  05/30/15   [provider]  Multiple Vitamin (MULTIVITAMIN WITH MINERALS) TABS tablet Take 1 tablet by mouth daily.    [provider]  OneTouch Delica Lancets [id number] MISC TEST BG 3 TIMES DAILY AS DIRECTED 12/30/16   [provider]  pantoprazole (PROTONIX) 40 MG tablet Take 40 mg by mouth daily.  04/13/15   [provider]  tamsulosin (FLOMAX) 0.4 MG CAPS capsule TAKE 1 CAPSULE BY MOUTH EVERY DAY AFTER SUPPER 04/17/20   [name], MD  traZODone (DESYREL) 50 MG tablet Take 50 mg by mouth at bedtime.    [provider]    Allergies Pollen extract and Tape  Family History  Problem Relation Age of Onset   Cancer Mother    Hypertension Mother    Cancer Father    Hypertension Father     Social History Social History   Tobacco Use   Smoking status: Former   Smokeless tobacco: Never  Scientific laboratory technician Use: Never used  Substance Use Topics   Alcohol use: Yes    Alcohol/week: 0.0 standard drinks   Drug use: No     Review of Systems  Constitutional: No fever/chills Eyes: No visual changes. No discharge ENT: No upper respiratory complaints. Cardiovascular: no chest pain. Respiratory: no cough. No SOB. Gastrointestinal: No abdominal pain.  No nausea, no vomiting.  No diarrhea.  No constipation. Musculoskeletal: Positive for cat bite to the right hand Skin: Negative for rash, abrasions, lacerations, ecchymosis. Neurological: Negative for headaches, focal weakness or numbness.  10 System ROS otherwise negative.  ____________________________________________   PHYSICAL  EXAM:  VITAL SIGNS: ED Triage Vitals  Enc Vitals Group     BP 11/14/20 1544 (!) 143/64     Pulse Rate 11/14/20 1544 67     Resp 11/14/20 1544 18     Temp 11/14/20 1544 98.3 F (36.8 C)     Temp Source 11/14/20 1544 Oral     SpO2 11/14/20 1544 98 %     Weight 11/14/20 1544 296 lb (134.3 kg)     Height 11/14/20 1544 6' (1.829 m)     Head Circumference --      Peak Flow --      Pain Score 11/14/20 1550 0     Pain Loc --      Pain Edu? --      Excl. in [place]? --      Constitutional: Alert and oriented. Well appearing and in no acute distress. Eyes: Conjunctivae are normal. PERRL. EOMI. Head: Atraumatic. ENT:      Ears:       Nose: No congestion/rhinnorhea.      Mouth/Throat: Mucous  membranes are moist.  Neck: No stridor.    Cardiovascular: Normal rate, regular rhythm. Normal S1 and S2.  Good peripheral circulation. Respiratory: Normal respiratory effort without tachypnea or retractions. Lungs CTAB. Good air entry to the bases with no decreased or absent breath sounds. Musculoskeletal: Full range of motion to all extremities. No gross deformities appreciated.  Visualization of the right hand reveals 2 superficial wounds consistent with cat bite.  No bleeding.  No retained foreign body.  Full range of motion of all digits.  Sensation capillary refill intact all digits. Neurologic:  Normal speech and language. No gross focal neurologic deficits are appreciated.  Skin:  Skin is warm, dry and intact. No rash noted. Psychiatric: Mood and affect are normal. Speech and behavior are normal. Patient exhibits appropriate insight and judgement.   ____________________________________________   LABS (all labs ordered are listed, but only abnormal results are displayed)  Labs Reviewed - No data to display ____________________________________________  EKG   ____________________________________________  RADIOLOGY   No results  found.  ____________________________________________    PROCEDURES  Procedure(s) performed:    Procedures    Medications  rabies vaccine (RABAVERT) injection 1 mL (has no administration in time range)  Tdap (BOOSTRIX) injection 0.5 mL (has no administration in time range)     ____________________________________________   INITIAL IMPRESSION / ASSESSMENT AND PLAN / ED COURSE  Pertinent labs & imaging results that were available during my care of the patient were reviewed by me and considered in my medical decision making (see chart for details).  Review of the [place] CSRS was performed in accordance of the [place] prior to dispensing any controlled drugs.           Patient's diagnosis is consistent with cat bite, need for rabies prophylaxis.  Patient presents the emergency department after being bit by a feral cat.  Patient will have his tetanus shot updated.  Antibiotics will be prescribed prophylactically.  Patient has had the rabies series in the past, and as such he only needs a rabies vaccine today and in 3 days.  There is no recommendation for immunoglobin as again the patient has already had rabies series.  At this time patient may follow-up with urgent care in 3 days for his second and final rabies vaccine..  Return precautions discussed with the patient.  Patient is given ED precautions to return to the ED for any worsening or new symptoms.     ____________________________________________  FINAL CLINICAL IMPRESSION(S) / ED DIAGNOSES  Final diagnoses:  Cat bite, initial encounter  Need for post exposure prophylaxis for rabies      NEW MEDICATIONS STARTED DURING THIS VISIT:  ED Discharge Orders          Ordered    amoxicillin-clavulanate (AUGMENTIN) 875-125 MG tablet  2 times daily        11/14/20 1934                This chart was dictated using voice recognition software/Dragon. Despite best efforts to proofread, errors can occur which can change  the meaning. Any change was purely unintentional.    Darletta Moll, PA-C 11/14/20 1936    Merlyn Lot, MD 11/14/20 2336

## 2020-11-14 NOTE — ED Triage Notes (Signed)
Pt reports that he was feeding a stray cat that he has been trying to get to a vet and they bit him on his right hand. There appears to be a small abrasion with no bleeding.

## 2020-11-17 ENCOUNTER — Encounter: Payer: Self-pay | Admitting: Emergency Medicine

## 2020-11-17 ENCOUNTER — Ambulatory Visit
Admission: EM | Admit: 2020-11-17 | Discharge: 2020-11-17 | Disposition: A | Discharge status: Home / Self Care | Payer: Medicare HMO | Attending: Physician Assistant | Admitting: Physician Assistant

## 2020-11-17 ENCOUNTER — Other Ambulatory Visit: Payer: Self-pay

## 2020-11-17 DIAGNOSIS — Z203 Contact with and (suspected) exposure to rabies: Secondary | ICD-10-CM | POA: Diagnosis not present

## 2020-11-17 MED ORDER — RABIES VACCINE, PCEC IM SUSR
1.0000 mL | Freq: Once | INTRAMUSCULAR | Status: AC
  Administered 2020-11-17: 1 mL via INTRAMUSCULAR

## 2020-11-17 NOTE — ED Triage Notes (Signed)
Patient here for his Day 3 and final rabies injection per [facility] ED discharge note.  Patient has no complaints at this time.

## 2021-02-07 ENCOUNTER — Other Ambulatory Visit: Payer: Self-pay | Admitting: Urology

## 2021-04-25 ENCOUNTER — Other Ambulatory Visit: Payer: Self-pay

## 2021-04-25 ENCOUNTER — Emergency Department
Admission: EM | Admit: 2021-04-25 | Discharge: 2021-04-26 | Disposition: A | Discharge status: Home / Self Care | Payer: Medicare HMO | Attending: Emergency Medicine | Admitting: Emergency Medicine

## 2021-04-25 ENCOUNTER — Emergency Department: Payer: Medicare HMO

## 2021-04-25 DIAGNOSIS — J0100 Acute maxillary sinusitis, unspecified: Secondary | ICD-10-CM | POA: Diagnosis not present

## 2021-04-25 DIAGNOSIS — R42 Dizziness and giddiness: Secondary | ICD-10-CM | POA: Diagnosis present

## 2021-04-25 DIAGNOSIS — I480 Paroxysmal atrial fibrillation: Secondary | ICD-10-CM | POA: Insufficient documentation

## 2021-04-25 DIAGNOSIS — I10 Essential (primary) hypertension: Secondary | ICD-10-CM | POA: Insufficient documentation

## 2021-04-25 LAB — BASIC METABOLIC PANEL
Anion gap: 11 (ref 5–15)
BUN: 15 mg/dL (ref 8–23)
CO2: 25 mmol/L (ref 22–32)
Calcium: 9.1 mg/dL (ref 8.9–10.3)
Chloride: 103 mmol/L (ref 98–111)
Creatinine, Ser: 0.93 mg/dL (ref 0.61–1.24)
GFR, Estimated: >60 mL/min (ref >60)
Glucose, Bld: 169 mg/dL — ABNORMAL HIGH (ref 70–99)
Potassium: 3.7 mmol/L (ref 3.5–5.1)
Sodium: 139 mmol/L (ref 135–145)

## 2021-04-25 LAB — CBC
HCT: 45.5 % (ref 39.0–52.0)
Hemoglobin: 14.7 g/dL (ref 13.0–17.0)
MCH: 29 pg (ref 26.0–34.0)
MCHC: 32.3 g/dL (ref 30.0–36.0)
MCV: 89.7 fL (ref 80.0–100.0)
Platelets: 173 10*3/uL (ref 150–400)
RBC: 5.07 MIL/uL (ref 4.22–5.81)
RDW: 12.9 % (ref 11.5–15.5)
WBC: 6.6 10*3/uL (ref 4.0–10.5)
nRBC: 0 % (ref 0.0–0.2)

## 2021-04-25 LAB — URINALYSIS, ROUTINE W REFLEX MICROSCOPIC
Bilirubin Urine: NEGATIVE
Glucose, UA: NEGATIVE mg/dL
Hgb urine dipstick: NEGATIVE
Ketones, ur: NEGATIVE mg/dL
Leukocytes,Ua: NEGATIVE
Nitrite: NEGATIVE
Protein, ur: NEGATIVE mg/dL
Specific Gravity, Urine: 1.008 (ref 1.005–1.030)
pH: 7 (ref 5.0–8.0)

## 2021-04-25 LAB — APTT: aPTT: 39 seconds — ABNORMAL HIGH (ref 24–36)

## 2021-04-25 LAB — PROTIME-INR
INR: 1.1 (ref 0.8–1.2)
Prothrombin Time: 14.6 seconds (ref 11.4–15.2)

## 2021-04-25 LAB — TROPONIN I (HIGH SENSITIVITY): Troponin I (High Sensitivity): 3 ng/L (ref <18)

## 2021-04-25 MED ORDER — AZITHROMYCIN 250 MG PO TABS: ORAL_TABLET | ORAL | 0 refills | Status: DC

## 2021-04-25 NOTE — ED Provider Notes (Signed)
[facility] Provider Note    Event Date/Time   First MD Initiated Contact with Patient 04/25/21 2145     (approximate)   History   Dizziness   HPI  Ronnie Cunningham is a 71 y.o. male here with dizziness, lightheadedness. Pt states that over the past several days, he has noticed that he has felt somewhat lightheaded occasionally, as well as had episodes in which he felt like his heart was beating quickly. Pt states he has a h/o AFib and has had similar issues when he has been sick or had RVR in the past. Reports he has a h/o recurrent sinusitis, as well as prostate/UTI issues and he is concerned he may have an infection. He describes general fatigue, some head congestion, mild lightheadedness, as well as palpitations occasionally with feeling of his heart beating irregularly. No known fevers. He also expresses some concern that he was scratched by his cat, and has had some enlarged LN in his L axillae.     Physical Exam   Triage Vital Signs: ED Triage Vitals  Enc Vitals Group     BP 04/25/21 2019 138/78     Pulse Rate 04/25/21 2019 70     Resp 04/25/21 2019 18     Temp 04/25/21 2019 98.4 F (36.9 C)     Temp Source 04/25/21 2019 Oral     SpO2 04/25/21 2019 97 %     Weight 04/25/21 2015 295 lb (133.8 kg)     Height 04/25/21 2015 6' (1.829 m)     Head Circumference --      Peak Flow --      Pain Score 04/25/21 2016 0     Pain Loc --      Pain Edu? --      Excl. in [place]? --     Most recent vital signs: Vitals:   04/25/21 2230 04/26/21 0006  BP: 119/60 (!) 143/65  Pulse: 65 73  Resp: 17 15  Temp:    SpO2: 97% 98%     General: Awake, no distress.  CV:  Good peripheral perfusion. Slight systolic murmur. Regular rate and rhythm.  Resp:  Normal effort. Lungs CTAB with no w/r/r. Abd:  No distention. No tenderness. Other:  No appreciable LAD in b/l axillae or inguinal areas. No skin redness or lesions. No edema. CN II-XII intact, strength and  sensation intact. No focal neuro deficits.   ED Results / Procedures / Treatments   Labs (all labs ordered are listed, but only abnormal results are displayed) Labs Reviewed  BASIC METABOLIC PANEL - Abnormal; Notable for the following components:      Result Value   Glucose, Bld 169 (*)    All other components within normal limits  URINALYSIS, ROUTINE W REFLEX MICROSCOPIC - Abnormal; Notable for the following components:   Color, Urine YELLOW (*)    APPearance CLEAR (*)    All other components within normal limits  APTT - Abnormal; Notable for the following components:   aPTT 39 (*)    All other components within normal limits  CBC  PROTIME-INR  TROPONIN I (HIGH SENSITIVITY)     EKG Normal sinus rhythm, VR 72. PR 164, QRS 142, QTc 459. No acute ST elevations or depressions.    RADIOLOGY CXR: Clear, no focal abnormality   I also independently reviewed and agree wit radiologist interpretations.   PROCEDURES:  Critical Care performed: No  .1-3 Lead EKG Interpretation Performed by: Duffy Bruce, MD  Authorized by: Duffy Bruce, MD     Interpretation: normal     ECG rate:  70-90   ECG rate assessment: normal     Rhythm: sinus rhythm     Ectopy: none     Conduction: normal   Comments:     Indication: weakness    MEDICATIONS ORDERED IN ED: Medications - No data to display   IMPRESSION / MDM / [place] / ED COURSE  I reviewed the triage vital signs and the nursing notes.                               The patient is on the cardiac monitor to evaluate for evidence of arrhythmia and/or significant heart rate changes.   MDM:  71 yo M with PMhx AFIB, HTN, recurrent sinusitis, here with generalized fatigue, transient palpitations. Pt is in NSR here, with nonischemic EKG, negative troponin, and no signs to suggest malignant arrhythmia or ACS. Lytes, BMP are unremarkable. CBC with no leukocytosis or anemia. UA negative. CXR is clear.   I had a  long discussion with pt re: his sx and this seems to be a more recurring issue when his AFib "acts up," often in setting of sinusitis, UTI, or other infection. He is on anticoagulation, appropriately medications, and has no signs of RVR currently. He does endorse sx c/w sinusitis, and has reported cat scartch + LAD though no other evidence of bartonella on exam.   Will tx for possible sinusitis with azithro, which will also cover cat scratch. No other apparent emergent medical condition. Pt is at his baseline and feels comfortable with this plan. Encouraged hydration and good return precautions.   MEDICATIONS GIVEN IN ED: Medications - No data to display   Consults:  None     FINAL CLINICAL IMPRESSION(S) / ED DIAGNOSES   Final diagnoses:  Dizziness  Acute non-recurrent maxillary sinusitis  Paroxysmal atrial fibrillation ([place])     Rx / DC Orders   ED Discharge Orders          Ordered    azithromycin (ZITHROMAX Z-PAK) 250 MG tablet        04/25/21 2258             Note:  This document was prepared using Dragon voice recognition software and may include unintentional dictation errors.   Duffy Bruce, MD 04/26/21 [phone]

## 2021-04-25 NOTE — ED Triage Notes (Addendum)
Pt presents via POV with complaints of dizziness for the last 2-3 days. Hx of afib (on Metoprolol & Eliquis) and he went to the local FD and had an EKG obtained which showed sinus rhythm w/ RBBB and was told to follow up here. Denies CP or SOB.

## 2021-05-07 ENCOUNTER — Other Ambulatory Visit: Payer: Self-pay | Admitting: Urology

## 2021-05-15 ENCOUNTER — Encounter: Payer: Self-pay | Admitting: Urology

## 2021-05-15 ENCOUNTER — Ambulatory Visit: Payer: Medicare HMO | Admitting: Urology

## 2021-05-15 ENCOUNTER — Other Ambulatory Visit: Payer: Self-pay

## 2021-05-15 VITALS — BP 146/72 | HR 73 | Ht 72.0 in | Wt 288.4 lb

## 2021-05-15 DIAGNOSIS — Z125 Encounter for screening for malignant neoplasm of prostate: Secondary | ICD-10-CM

## 2021-05-15 DIAGNOSIS — N138 Other obstructive and reflux uropathy: Secondary | ICD-10-CM

## 2021-05-15 DIAGNOSIS — N401 Enlarged prostate with lower urinary tract symptoms: Secondary | ICD-10-CM

## 2021-05-15 DIAGNOSIS — R102 Pelvic and perineal pain: Secondary | ICD-10-CM

## 2021-05-15 LAB — BLADDER SCAN AMB NON-IMAGING

## 2021-05-15 MED ORDER — FINASTERIDE 5 MG PO TABS: 5.0000 mg | ORAL_TABLET | Freq: Every day | ORAL | 12 refills | Status: DC

## 2021-05-15 MED ORDER — TAMSULOSIN HCL 0.4 MG PO CAPS: 0.4000 mg | ORAL_CAPSULE | Freq: Every day | ORAL | 11 refills | Status: DC

## 2021-05-15 NOTE — Progress Notes (Signed)
? ?  05/15/2021 ?4:11 PM  ? ?Ronnie Cunningham ?[birth year] ?[id number] ? ?Reason for visit: Groin/hip pain, BPH, possible prostatitis, history of recurrent UTI ? ?HPI: ?71 year old male who I last saw in December 2021 for follow-up of BPH, PSA screening, and recurrent UTIs.  He has a family history of lethal prostate cancer, and PSAs have been very low on monitoring, most recently 0.26(corrected for finasteride 0.5 to) in August 2022.  He is on Flomax and finasteride at baseline, and prostate measures 30 g on a prior CT. ? ?He has had a number of issues over the last few months.  He had a sinus infection with some dizziness and possible exacerbation of his A-fib that resulted in an ER visit.  He wondered if he could have a prostatitis causing his symptoms as well, but urinalysis was completely benign.  Repeat urinalysis with PCP 05/11/2021 was again completely benign.  He really denies any significant change in the urinary symptoms aside from some occasional weak stream, and his primary complaint today is groin, hip, and lower back pain.  He is currently on a course of Levaquin for possible persistent sinusitis.  PVR today is normal at 0 mL. ? ?Reassurance was provided that it would be very unlikely his dizziness and hip pain/groin pain would be related to prostatitis.  Regardless, Levaquin would be an excellent antibiotic to cover any possible prostate infection, and I recommended he complete the course that was prescribed by his PCP for possible sinusitis.  He is emptying his bladder well at this time.  I recommended continuing Flomax and finasteride, with follow-up in 1 year regarding his urinary symptoms, and we could consider cystoscopy in the future for further evaluation of his urinary symptoms and consideration of outlet procedures, like UroLift ? ? ?[name], MD ? ?[facility] ?[address], [address] ?[place], [place] [address] ?[phone] ? ?

## 2021-07-20 ENCOUNTER — Other Ambulatory Visit: Payer: Self-pay | Admitting: Internal Medicine

## 2021-07-20 DIAGNOSIS — M5441 Lumbago with sciatica, right side: Secondary | ICD-10-CM

## 2021-07-20 DIAGNOSIS — R1031 Right lower quadrant pain: Secondary | ICD-10-CM

## 2021-07-23 ENCOUNTER — Other Ambulatory Visit: Payer: Self-pay | Admitting: Internal Medicine

## 2021-07-23 DIAGNOSIS — M5441 Lumbago with sciatica, right side: Secondary | ICD-10-CM

## 2021-07-23 DIAGNOSIS — R1031 Right lower quadrant pain: Secondary | ICD-10-CM

## 2021-07-25 ENCOUNTER — Ambulatory Visit
Admission: RE | Admit: 2021-07-25 | Discharge: 2021-07-25 | Disposition: A | Discharge status: Home / Self Care | Payer: Medicare HMO | Source: Ambulatory Visit | Attending: Internal Medicine | Admitting: Internal Medicine

## 2021-07-25 ENCOUNTER — Other Ambulatory Visit: Payer: Medicare HMO

## 2021-07-25 DIAGNOSIS — R1031 Right lower quadrant pain: Secondary | ICD-10-CM

## 2021-07-25 DIAGNOSIS — M5441 Lumbago with sciatica, right side: Secondary | ICD-10-CM

## 2021-07-25 MED ORDER — IOPAMIDOL (ISOVUE-300) INJECTION 61%
100.0000 mL | Freq: Once | INTRAVENOUS | Status: AC | PRN
  Administered 2021-07-25: 100 mL via INTRAVENOUS

## 2021-08-30 ENCOUNTER — Encounter: Payer: Self-pay | Admitting: Emergency Medicine

## 2021-08-30 ENCOUNTER — Ambulatory Visit
Admission: EM | Admit: 2021-08-30 | Discharge: 2021-08-30 | Disposition: A | Discharge status: Home / Self Care | Payer: Medicare HMO

## 2021-08-30 ENCOUNTER — Other Ambulatory Visit: Payer: Self-pay

## 2021-08-30 DIAGNOSIS — J0140 Acute pansinusitis, unspecified: Secondary | ICD-10-CM | POA: Diagnosis not present

## 2021-08-30 MED ORDER — IPRATROPIUM BROMIDE 0.06 % NA SOLN: 2.0000 | Freq: Four times a day (QID) | NASAL | 0 refills | Status: DC

## 2021-08-30 MED ORDER — LEVOFLOXACIN 500 MG PO TABS: 500.0000 mg | ORAL_TABLET | Freq: Every day | ORAL | 0 refills | Status: AC

## 2021-08-30 NOTE — ED Triage Notes (Signed)
Onset of symptoms 3 weeks ago.  Symptoms include headache, left ear pain, hoarseness, nasal drainage.  Patient has a history of sinus infections as well as allergies.    Patient received augmentin 2-3 weeks ago at [facility].  Finished antibiotic 2-3 days ago

## 2021-08-30 NOTE — ED Provider Notes (Signed)
[facility]    CSN: [id number] Arrival date & time: 08/30/21  1747      History   Chief Complaint No chief complaint on file.   HPI Ronnie Cunningham is a 71 y.o. male presenting for 3-week history of headaches, sinus pressure, bilateral ear pain, voice hoarseness, postnasal drainage, scratchy throat.  Patient reports his nasal drainage is greenish-brown.  States he was seen at [facility] clinic for this. Patient treated on 08/16/2021 with 10-day course of Augmentin for sinusitis.  Also prescribed prednisone at that time.  Patient reports essentially no improvement in his symptoms.  He just finished this medication about 4 days ago.  He denies any worsening of his symptoms.  He has not had any fevers.  He has been fatigued.  He denies cough or breathing difficulty, vomiting or diarrhea.  Patient has history of asthma, atrial fibrillation and is anticoagulated with Eliquis, allergies, laryngeal spasm, hypertension, GERD, diabetes and obesity.  Patient reports history of sinus infections.  HPI  Past Medical History:  Diagnosis Date   Afib ([place])    Asthma    BPH (benign prostatic hyperplasia)    Diabetes mellitus without complication ([place])    GERD (gastroesophageal reflux disease)    Hypertension    Morbid obesity ([place])    Seasonal allergies    Sleep apnea    Spasmodic dysphonia     Patient Active Problem List   Diagnosis Date Noted   Atrial fibrillation with RVR ([place]) 10/28/2016   UTI (urinary tract infection) 10/26/2016   Morbid (severe) obesity due to excess calories ([place]) 04/12/2015   Morbid obesity ([place]) 04/12/2015   Borderline diabetes mellitus 02/22/2015   BP (high blood pressure) 02/16/2014   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   Abnormal blood sugar 09/15/2013   Chronic nonalcoholic liver disease 93/79/0240   Essential (primary) hypertension 09/15/2013   Combined fat and carbohydrate induced hyperlipemia 09/15/2013   H/O neoplasm 10/27/2012   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   Obstructive apnea 02/21/2012   Controlled type 2 diabetes mellitus without complication ([place]) 97/35/3299   Type 2 diabetes mellitus ([place]) 10/03/2011   Acute antritis 07/23/2011   Cough 07/23/2011   HLD (hyperlipidemia) 07/23/2011   Difficult or painful urination 06/11/2011   Actinic keratoses 06/01/2010    Past Surgical History:  Procedure Laterality Date   ADENOIDECTOMY     CARDIAC CATHETERIZATION     CARDIAC SURGERY     COLONOSCOPY WITH PROPOFOL N/A 05/10/2019   Procedure: COLONOSCOPY WITH PROPOFOL;  Surgeon: Toledo, Benay Pike, MD;  Location: [facility] ENDOSCOPY;  Service: Gastroenterology;  Laterality: N/A;   ESOPHAGOGASTRODUODENOSCOPY (EGD) WITH PROPOFOL N/A 05/10/2019   Procedure: ESOPHAGOGASTRODUODENOSCOPY (EGD) WITH PROPOFOL;  Surgeon: Toledo, Benay Pike, MD;  Location: [facility] ENDOSCOPY;  Service: Gastroenterology;  Laterality: N/A;   TONSILLECTOMY         Home Medications    Prior to Admission medications   Medication Sig Start Date End Date Taking? Authorizing Provider  ipratropium (ATROVENT) 0.06 % nasal spray Place 2 sprays into both nostrils 4 (four) times daily. 08/30/21  Yes Danton Clap, PA-C  levofloxacin (LEVAQUIN) 500 MG tablet Take 1 tablet (500 mg total) by mouth daily for 10 days. 08/30/21 09/09/21 Yes Danton Clap, PA-C  loratadine (CLARITIN) 10 MG tablet Take 10 mg by mouth daily.   Yes [provider]  albuterol (VENTOLIN HFA) 108 (90 Base) MCG/ACT inhaler Inhale into the lungs. 11/12/11   [provider]  atorvastatin (LIPITOR) 20 MG tablet Take 20 mg  by mouth at bedtime.  04/01/12   [provider]  baclofen (LIORESAL) 10 MG tablet Take 1 tablet by mouth 2 (two) times daily. 01/31/20   [provider]  Coenzyme Q10 (CO Q 10) 100 MG CAPS Take 2 capsules by mouth daily.     [provider]  diltiazem (CARDIZEM CD) 120 MG 24 hr capsule Take 120 mg by mouth daily. 04/14/21   [provider]  ELIQUIS 5 MG  TABS tablet SMARTSIG:1 Tablet(s) By Mouth Every 12 Hours 02/22/19   [provider]  escitalopram (LEXAPRO) 20 MG tablet Take 20 mg by mouth daily.  04/05/14   [provider]  finasteride (PROSCAR) 5 MG tablet Take 1 tablet (5 mg total) by mouth daily. 05/15/21   [name], MD  fluticasone (FLONASE) 50 MCG/ACT nasal spray Place 2 sprays into both nostrils daily.  12/08/14   [provider]  gabapentin (NEURONTIN) 300 MG capsule Take 1 capsule by mouth 2 (two) times daily. 05/09/21   [provider]  glucose blood (ONETOUCH ULTRA) test strip USE ONE DAILY 07/06/18   [provider]  ipratropium (ATROVENT) 0.06 % nasal spray Place into the nose. 10/12/20 10/12/21  [provider]  metFORMIN (GLUCOPHAGE) 1000 MG tablet Take 1,000 mg by mouth 2 (two) times daily. 03/18/19   [provider]  metoprolol tartrate (LOPRESSOR) 25 MG tablet Take 1 tablet (25 mg total) by mouth 2 (two) times daily. 10/29/16   Gladstone Lighter, MD  mirtazapine (REMERON) 15 MG tablet Take by mouth. 05/09/21 11/05/21  [provider]  montelukast (SINGULAIR) 10 MG tablet Take 10 mg by mouth daily as needed.  05/30/15   [provider]  Multiple Vitamin (MULTIVITAMIN WITH MINERALS) TABS tablet Take 1 tablet by mouth daily.    [provider]  OneTouch Delica Lancets 14G MISC TEST BG 3 TIMES DAILY AS DIRECTED 12/30/16   [provider]  pantoprazole (PROTONIX) 40 MG tablet Take 40 mg by mouth daily.  04/13/15   [provider]  predniSONE (DELTASONE) 10 MG tablet 2 tabs daily for 4 days, 1 tab daily for 4 days Patient not taking: Reported on 08/30/2021 05/11/21   [provider]  Semaglutide,0.25 or 0.'5MG'$ /DOS, (OZEMPIC, 0.25 OR 0.5 MG/DOSE,) 2 MG/1.5ML SOPN INJECT 0.375 MLS (0.5 MG TOTAL) SUBCUTANEOUSLY ONCE A WEEK FOR 180 DAYS 02/08/21   [provider]  tamsulosin (FLOMAX) 0.4 MG CAPS capsule Take 1 capsule (0.4 mg  total) by mouth daily after supper. 05/15/21   [name], MD  traMADol (ULTRAM) 50 MG tablet Take 50 mg by mouth 2 (two) times daily as needed. 03/09/21   [provider]    Family History Family History  Problem Relation Age of Onset   Cancer Mother    Hypertension Mother    Cancer Father    Hypertension Father     Social History Social History   Tobacco Use   Smoking status: Former   Smokeless tobacco: Never  Scientific laboratory technician Use: Never used  Substance Use Topics   Alcohol use: Yes    Alcohol/week: 0.0 standard drinks of alcohol   Drug use: No     Allergies   Doxycycline, Pollen extract, and Tape   Review of Systems Review of Systems  Constitutional:  Positive for fatigue. Negative for fever.  HENT:  Positive for congestion, postnasal drip, rhinorrhea, sinus pressure and sinus pain. Negative for sore throat.   Respiratory:  Negative for  cough and shortness of breath.   Cardiovascular:  Negative for chest pain.  Gastrointestinal:  Negative for abdominal pain, diarrhea, nausea and vomiting.  Musculoskeletal:  Negative for myalgias.  Neurological:  Positive for headaches. Negative for weakness and light-headedness.  Hematological:  Negative for adenopathy.     Physical Exam Triage Vital Signs ED Triage Vitals  Enc Vitals Group     BP      Pulse      Resp      Temp      Temp src      SpO2      Weight      Height      Head Circumference      Peak Flow      Pain Score      Pain Loc      Pain Edu?      Excl. in [place]?    No data found.  Updated Vital Signs BP 134/61 (BP Location: Left Arm)   Pulse 69   Temp 98.5 F (36.9 C) (Oral)   Resp 20   SpO2 97%    Physical Exam Vitals and nursing note reviewed.  Constitutional:      General: He is not in acute distress.    Appearance: Normal appearance. He is well-developed. He is not ill-appearing.     Comments: +voice hoarseness  HENT:     Head: Normocephalic and atraumatic.     Right  Ear: Ear canal and external ear normal. A middle ear effusion is present.     Left Ear: Ear canal and external ear normal. A middle ear effusion is present.     Nose: Congestion present.     Mouth/Throat:     Mouth: Mucous membranes are moist.     Pharynx: Oropharynx is clear. Posterior oropharyngeal erythema (mild with PND) present.  Eyes:     General: No scleral icterus.    Conjunctiva/sclera: Conjunctivae normal.  Cardiovascular:     Rate and Rhythm: Normal rate and regular rhythm.     Heart sounds: Normal heart sounds.  Pulmonary:     Effort: Pulmonary effort is normal. No respiratory distress.     Breath sounds: Normal breath sounds.  Musculoskeletal:     Cervical back: Neck supple.  Skin:    General: Skin is warm and dry.     Capillary Refill: Capillary refill takes less than 2 seconds.  Neurological:     General: No focal deficit present.     Mental Status: He is alert. Mental status is at baseline.     Motor: No weakness.     Coordination: Coordination normal.     Gait: Gait normal.  Psychiatric:        Mood and Affect: Mood normal.        Behavior: Behavior normal.      UC Treatments / Results  Labs (all labs ordered are listed, but only abnormal results are displayed) Labs Reviewed - No data to display  EKG   Radiology No results found.  Procedures Procedures (including critical care time)  Medications Ordered in UC Medications - No data to display  Initial Impression / Assessment and Plan / UC Course  I have reviewed the triage vital signs and the nursing notes.  Pertinent labs & imaging results that were available during my care of the patient were reviewed by me and considered in my medical decision making (see chart for details).  71 year old male presenting for 3-week history of sinus pain,  congestion, greenish-brown nasal drainage.  Patient seen 14 days ago and treated with 10-day course of Augmentin and a few days of prednisone.  Is also been  taking Singulair, Claritin and using Flonase and nasal saline.  Reports essentially no improvement in his symptoms.  Vitals normal and stable.  He is overall well-appearing.  Nasal congestion and mucosal edema without drainage.  Erythema posterior pharynx which is mild with postnasal drainage.  Positive sinus tenderness diffusely.  Chest clear to auscultation and heart regular rate and rhythm.  We will treat at this time with Levaquin for continued sinus infection.  Has history of allergy to doxycycline.  Also sent Atrovent nasal spray.  Encouraged him to continue his allergy medicine, rest and fluids.  Reviewed return precautions.   Final Clinical Impressions(s) / UC Diagnoses   Final diagnoses:  Acute pansinusitis, recurrence not specified     Discharge Instructions      -Continue sinus infection.  I have sent a new antibiotic to the pharmacy.  Stronger.  Take full course but - I also sent a nasal spray. - Continue with your allergy medicine and saline rinses. - Follow-up with your primary care provider if you are still feeling poorly after you finish your medicine or return to our department sooner for any worsening symptoms.     ED Prescriptions     Medication Sig Dispense Auth. Provider   levofloxacin (LEVAQUIN) 500 MG tablet Take 1 tablet (500 mg total) by mouth daily for 10 days. 10 tablet Laurene Footman B, PA-C   ipratropium (ATROVENT) 0.06 % nasal spray Place 2 sprays into both nostrils 4 (four) times daily. 15 mL Danton Clap, PA-C      PDMP not reviewed this encounter.   Danton Clap, PA-C 08/30/21 1923

## 2021-08-30 NOTE — Discharge Instructions (Addendum)
-  Continue sinus infection.  I have sent a new antibiotic to the pharmacy.  Stronger.  Take full course but - I also sent a nasal spray. - Continue with your allergy medicine and saline rinses. - Follow-up with your primary care provider if you are still feeling poorly after you finish your medicine or return to our department sooner for any worsening symptoms.

## 2022-05-16 ENCOUNTER — Ambulatory Visit: Payer: Medicare HMO | Admitting: Urology

## 2022-05-16 VITALS — BP 121/72 | Ht 72.0 in | Wt 288.0 lb

## 2022-05-16 DIAGNOSIS — Z87440 Personal history of urinary (tract) infections: Secondary | ICD-10-CM

## 2022-05-16 DIAGNOSIS — N138 Other obstructive and reflux uropathy: Secondary | ICD-10-CM

## 2022-05-16 DIAGNOSIS — Z87438 Personal history of other diseases of male genital organs: Secondary | ICD-10-CM

## 2022-05-16 DIAGNOSIS — N401 Enlarged prostate with lower urinary tract symptoms: Secondary | ICD-10-CM | POA: Diagnosis not present

## 2022-05-16 LAB — BLADDER SCAN AMB NON-IMAGING: Scan Result: 16

## 2022-05-16 MED ORDER — TAMSULOSIN HCL 0.4 MG PO CAPS: 0.4000 mg | ORAL_CAPSULE | Freq: Every day | ORAL | 3 refills | Status: DC

## 2022-05-16 MED ORDER — FINASTERIDE 5 MG PO TABS: 5.0000 mg | ORAL_TABLET | Freq: Every day | ORAL | 3 refills | Status: DC

## 2022-05-16 NOTE — Patient Instructions (Signed)

## 2022-05-16 NOTE — Progress Notes (Signed)
   05/16/2022 4:26 PM   Ronnie Cunningham 1950/08/26 [id number]  Reason for visit: Groin/hip pain, BPH,  history of recurrent UTI  HPI: 72 year old male with a long history of groin and lower back pain of unclear etiology, BPH, and recurrent UTIs.  No UTIs over the last year, PSAs have been normal, most recent 0.22 in April 2023, connected for finasteride 0.44.  He is on maximal medical therapy with Flomax and finasteride.  Prostate measures 30 g on prior CT.  I personally viewed and interpreted the most recent CT from May 2023 that was performed for abdominal pain which shows no stones, hydronephrosis, normal-appearing bladder, 30 g prostate.  Reassurance was provided that there is no urologic cause for his bilateral low back and hip pain.  Urinalysis in December was completely benign, normal renal function with creatinine 0.9.  His urinary symptoms overall are well-controlled on maximal medical therapy, however he would like to get off these medications if possible.  He had questions about UroLift today.  I recommended a cystoscopy for further evaluation of his anatomy to see if he would be a candidate for UroLift.  Schedule cystoscopy.  [name], [facility] [address], [place], [place] [address] [phone]

## 2022-05-17 LAB — URINALYSIS, COMPLETE
Bilirubin, UA: NEGATIVE
Glucose, UA: NEGATIVE
Ketones, UA: NEGATIVE
Leukocytes,UA: NEGATIVE
Nitrite, UA: NEGATIVE
Protein,UA: NEGATIVE
RBC, UA: NEGATIVE
Specific Gravity, UA: 1.01 (ref 1.005–1.030)
Urobilinogen, Ur: 0.2 mg/dL (ref 0.2–1.0)
pH, UA: 6 (ref 5.0–7.5)

## 2022-05-17 LAB — MICROSCOPIC EXAMINATION

## 2022-06-12 ENCOUNTER — Encounter: Payer: Self-pay | Admitting: Emergency Medicine

## 2022-06-12 ENCOUNTER — Ambulatory Visit
Admission: EM | Admit: 2022-06-12 | Discharge: 2022-06-12 | Disposition: A | Discharge status: Home / Self Care | Payer: Medicare HMO | Attending: Family Medicine | Admitting: Family Medicine

## 2022-06-12 DIAGNOSIS — J329 Chronic sinusitis, unspecified: Secondary | ICD-10-CM | POA: Diagnosis not present

## 2022-06-12 MED ORDER — CEFDINIR 300 MG PO CAPS: 300.0000 mg | ORAL_CAPSULE | Freq: Two times a day (BID) | ORAL | 0 refills | Status: AC

## 2022-06-12 MED ORDER — ALBUTEROL SULFATE HFA 108 (90 BASE) MCG/ACT IN AERS: 2.0000 | INHALATION_SPRAY | RESPIRATORY_TRACT | 0 refills | Status: AC | PRN

## 2022-06-12 MED ORDER — PREDNISONE 10 MG (21) PO TBPK: ORAL_TABLET | Freq: Every day | ORAL | 0 refills | Status: DC

## 2022-06-12 NOTE — Discharge Instructions (Addendum)
Stop by the pharmacy to pick up your prescriptions.  Follow up with your primary care provider as needed.  Be sure to monitor your blood sugar and blood pressure while taking prednisone

## 2022-06-12 NOTE — ED Triage Notes (Signed)
Pt c/o sinus pressure, congestion, headache and bilateral ear pain x 3 weeks. Pt has tried nasal spray and allergy medication for symptom relief.

## 2022-06-12 NOTE — ED Provider Notes (Signed)
[facility]    CSN: [id number] Arrival date & time: 06/12/22  1740      History   Chief Complaint Chief Complaint  Patient presents with   Nasal Congestion   Headache   Sinus Pressure     HPI Ronnie MoralesJames T Cunningham is a 72 y.o. male.   HPI   Ronnie FearingJames presents for nasal congestion, headache, sinus pressure, runny nose and nausea and postnasal drip for the past 2 to 3 weeks.Ronnie Cunningham.  He does not feel like his symptoms are getting better.  He tried over-the-counter medications without relief.  Denies fever, chills, nausea, vomiting or diarrhea.  History of asthma not in occasionally has to use his albuterol inhaler has been taking allergy medicine.    Past Medical History:  Diagnosis Date   Afib    Asthma    BPH (benign prostatic hyperplasia)    Diabetes mellitus without complication    GERD (gastroesophageal reflux disease)    Hypertension    Morbid obesity    Seasonal allergies    Sleep apnea    Spasmodic dysphonia     Patient Active Problem List   Diagnosis Date Noted   Atrial fibrillation with RVR 10/28/2016   UTI (urinary tract infection) 10/26/2016   Morbid (severe) obesity due to excess calories 04/12/2015   Morbid obesity 04/12/2015   Borderline diabetes mellitus 02/22/2015   BP (high blood pressure) 02/16/2014   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   Abnormal blood sugar 09/15/2013   Chronic nonalcoholic liver disease 09/15/2013   Essential (primary) hypertension 09/15/2013   Combined fat and carbohydrate induced hyperlipemia 09/15/2013   H/O neoplasm 10/27/2012   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   Obstructive apnea 02/21/2012   Controlled type 2 diabetes mellitus without complication 10/03/2011   Type 2 diabetes mellitus 10/03/2011   Acute antritis 07/23/2011   Cough 07/23/2011   HLD (hyperlipidemia) 07/23/2011   Difficult or painful urination 06/11/2011   Actinic keratoses 06/01/2010    Past Surgical History:  Procedure Laterality Date   ADENOIDECTOMY      CARDIAC CATHETERIZATION     CARDIAC SURGERY     COLONOSCOPY WITH PROPOFOL N/A 05/10/2019   Procedure: COLONOSCOPY WITH PROPOFOL;  Surgeon: Toledo, Boykin Nearingeodoro K, MD;  Location: [facility];  Service: Gastroenterology;  Laterality: N/A;   ESOPHAGOGASTRODUODENOSCOPY (EGD) WITH PROPOFOL N/A 05/10/2019   Procedure: ESOPHAGOGASTRODUODENOSCOPY (EGD) WITH PROPOFOL;  Surgeon: Toledo, Boykin Nearingeodoro K, MD;  Location: [facility];  Service: Gastroenterology;  Laterality: N/A;   TONSILLECTOMY         Home Medications    Prior to Admission medications   Medication Sig Start Date End Date Taking? Authorizing Provider  cefdinir (OMNICEF) 300 MG capsule Take 1 capsule (300 mg total) by mouth 2 (two) times daily for 7 days. 06/12/22 06/19/22 Yes Filimon Miranda, DO  predniSONE (STERAPRED UNI-PAK 21 TAB) 10 MG (21) TBPK tablet Take by mouth daily. Take 6 tabs by mouth daily for 1, then 5 tabs for 1 day, then 4 tabs for 1 day, then 3 tabs for 1 day, then 2 tabs for 1 day, then 1 tab for 1 day. 06/12/22  Yes Ymani Porcher, DO  albuterol (VENTOLIN HFA) 108 (90 Base) MCG/ACT inhaler Inhale 2 puffs into the lungs every 4 (four) hours as needed for wheezing or shortness of breath. Inhale into the lungs. 06/12/22   Lakela Kuba, [place] MethVondra, DO  atorvastatin (LIPITOR) 20 MG tablet Take 20 mg by mouth at bedtime.  04/01/12   [provider]  baclofen (LIORESAL) 10 MG  tablet Take 1 tablet by mouth 2 (two) times daily. 01/31/20   [provider]  Coenzyme Q10 (CO Q 10) 100 MG CAPS Take 2 capsules by mouth daily.     [provider]  diltiazem (CARDIZEM CD) 120 MG 24 hr capsule Take 120 mg by mouth daily. 04/14/21   [provider]  ELIQUIS 5 MG TABS tablet SMARTSIG:1 Tablet(s) By Mouth Every 12 Hours 02/22/19   [provider]  escitalopram (LEXAPRO) 20 MG tablet Take 20 mg by mouth daily.  04/05/14   [provider]  finasteride (PROSCAR) 5 MG tablet Take 1 tablet (5 mg total) by mouth  daily. 05/16/22   Sondra Come, MD  fluticasone (FLONASE) 50 MCG/ACT nasal spray Place 2 sprays into both nostrils daily.  12/08/14   [provider]  gabapentin (NEURONTIN) 300 MG capsule Take 1 capsule by mouth 2 (two) times daily. 05/09/21   [provider]  glucose blood (ONETOUCH ULTRA) test strip USE ONE DAILY 07/06/18   [provider]  ipratropium (ATROVENT) 0.06 % nasal spray Place into the nose. 10/12/20 10/12/21  [provider]  ipratropium (ATROVENT) 0.06 % nasal spray Place 2 sprays into both nostrils 4 (four) times daily. 08/30/21   Shirlee Latch, PA-C  loratadine (CLARITIN) 10 MG tablet Take 10 mg by mouth daily.    [provider]  metFORMIN (GLUCOPHAGE) 1000 MG tablet Take 1,000 mg by mouth 2 (two) times daily. 03/18/19   [provider]  metoprolol tartrate (LOPRESSOR) 25 MG tablet Take 1 tablet (25 mg total) by mouth 2 (two) times daily. 10/29/16   Enid Baas, MD  mirtazapine (REMERON) 15 MG tablet Take by mouth. 05/09/21 11/05/21  [provider]  montelukast (SINGULAIR) 10 MG tablet Take 10 mg by mouth daily as needed.  05/30/15   [provider]  Multiple Vitamin (MULTIVITAMIN WITH MINERALS) TABS tablet Take 1 tablet by mouth daily.    [provider]  OneTouch Delica Lancets 33G MISC TEST BG 3 TIMES DAILY AS DIRECTED 12/30/16   [provider]  pantoprazole (PROTONIX) 40 MG tablet Take 40 mg by mouth daily.  04/13/15   [provider]  Semaglutide,0.25 or 0.5MG /DOS, (OZEMPIC, 0.25 OR 0.5 MG/DOSE,) 2 MG/1.5ML SOPN INJECT 0.375 MLS (0.5 MG TOTAL) SUBCUTANEOUSLY ONCE A WEEK FOR 180 DAYS 02/08/21   [provider]  tamsulosin (FLOMAX) 0.4 MG CAPS capsule Take 1 capsule (0.4 mg total) by mouth daily after supper. 05/16/22   Sondra Come, MD  traMADol (ULTRAM) 50 MG tablet Take 50 mg by mouth 2 (two) times daily as needed. 03/09/21   [provider]    Family  History Family History  Problem Relation Age of Onset   Cancer Mother    Hypertension Mother    Cancer Father    Hypertension Father     Social History Social History   Tobacco Use   Smoking status: Former   Smokeless tobacco: Never  Building services engineer Use: Never used  Substance Use Topics   Alcohol use: Yes    Alcohol/week: 0.0 standard drinks of alcohol   Drug use: No     Allergies   Doxycycline, Pollen extract, and Tape   Review of Systems Review of Systems: negative unless otherwise stated in HPI.      Physical Exam Triage Vital Signs ED Triage Vitals  Enc Vitals Group     BP      Pulse  Resp      Temp      Temp src      SpO2      Weight      Height      Head Circumference      Peak Flow      Pain Score      Pain Loc      Pain Edu?      Excl. in [place]?    No data found.  Updated Vital Signs BP 127/67 (BP Location: Left Arm)   Pulse 66   Temp 98.3 F (36.8 C) (Oral)   Resp 16   SpO2 97%   Visual Acuity Right Eye Distance:   Left Eye Distance:   Bilateral Distance:    Right Eye Near:   Left Eye Near:    Bilateral Near:     Physical Exam GEN:     alert, non-toxic appearing male in no distress    HENT:  mucus membranes moist, oropharyngeal without lesions or exudate, no tonsillar hypertrophy, moderate erythematous edematous turbinates, clear nasal discharge, bilateral TM effusions, hoarse voice, postnasal drip, frontal and maxillary sinus tenderness left greater than right EYES:   pupils equal and reactive, no scleral injection or discharge NECK:  normal ROM, left anterior lymphadenopathy, no meningismus   RESP:  no increased work of breathing, scattered expiratory wheezing  CVS:   regular rate and rhythm Skin:   warm and dry, no rash on visible skin    UC Treatments / Results  Labs (all labs ordered are listed, but only abnormal results are displayed) Labs Reviewed - No data to display  EKG   Radiology No results  found.  Procedures Procedures (including critical care time)  Medications Ordered in UC Medications - No data to display  Initial Impression / Assessment and Plan / UC Course  I have reviewed the triage vital signs and the nursing notes.  Pertinent labs & imaging results that were available during my care of the patient were reviewed by me and considered in my medical decision making (see chart for details).       Pt is a 72 y.o. male who presents for 3-week history of sinus pressure nasal congestion with intermittent cough. [place] is afebrile here without recent antipyretics. Satting well on room air. Overall pt is non-toxic appearing, well hydrated, without respiratory distress. Pulmonary exam is remarkable for scattered expiratory wheezing.  COVID and influenza testing deferred due to duration of symptoms.  He has frontal and maxillary sinus tenderness greater on the left than the right.  Treat acute rhinosinusitis with steroids and antibiotics as below.  Refilled albuterol inhaler.  Typical duration of symptoms discussed. Return and ED precautions given and patient voiced understanding.    Return and ED precautions given and voiced understanding. Discussed MDM, treatment plan and plan for follow-up with patient who agrees with plan.     Final Clinical Impressions(s) / UC Diagnoses   Final diagnoses:  Rhinosinusitis     Discharge Instructions      Stop by the pharmacy to pick up your prescriptions.  Follow up with your primary care provider as needed.  Be sure to monitor your blood sugar and blood pressure while taking prednisone      ED Prescriptions     Medication Sig Dispense Auth. Provider   cefdinir (OMNICEF) 300 MG capsule Take 1 capsule (300 mg total) by mouth 2 (two) times daily for 7 days. 14 capsule Rheba Diamond, DO   predniSONE (STERAPRED  UNI-PAK 21 TAB) 10 MG (21) TBPK tablet Take by mouth daily. Take 6 tabs by mouth daily for 1, then 5 tabs for 1 day,  then 4 tabs for 1 day, then 3 tabs for 1 day, then 2 tabs for 1 day, then 1 tab for 1 day. 21 tablet Dameir Gentzler, DO   albuterol (VENTOLIN HFA) 108 (90 Base) MCG/ACT inhaler Inhale 2 puffs into the lungs every 4 (four) hours as needed for wheezing or shortness of breath. Inhale into the lungs. 6.7 g Katha Cabal, DO      PDMP not reviewed this encounter.   Katha Cabal, DO 06/12/22 1835

## 2022-07-31 ENCOUNTER — Other Ambulatory Visit: Payer: Self-pay

## 2022-07-31 DIAGNOSIS — M5441 Lumbago with sciatica, right side: Secondary | ICD-10-CM

## 2022-07-31 DIAGNOSIS — M5442 Lumbago with sciatica, left side: Secondary | ICD-10-CM

## 2022-08-02 ENCOUNTER — Other Ambulatory Visit: Payer: Self-pay | Admitting: Physical Medicine & Rehabilitation

## 2022-08-02 DIAGNOSIS — M5441 Lumbago with sciatica, right side: Secondary | ICD-10-CM

## 2022-08-02 DIAGNOSIS — M5442 Lumbago with sciatica, left side: Secondary | ICD-10-CM

## 2022-08-22 ENCOUNTER — Ambulatory Visit
Admission: RE | Admit: 2022-08-22 | Discharge: 2022-08-22 | Disposition: A | Discharge status: Home / Self Care | Payer: Medicare HMO | Source: Ambulatory Visit | Attending: Physical Medicine & Rehabilitation | Admitting: Physical Medicine & Rehabilitation

## 2022-08-22 DIAGNOSIS — M5442 Lumbago with sciatica, left side: Secondary | ICD-10-CM

## 2022-08-22 DIAGNOSIS — M5441 Lumbago with sciatica, right side: Secondary | ICD-10-CM

## 2022-09-09 ENCOUNTER — Other Ambulatory Visit: Payer: Self-pay | Admitting: Physical Medicine & Rehabilitation

## 2022-09-09 DIAGNOSIS — M5414 Radiculopathy, thoracic region: Secondary | ICD-10-CM

## 2022-09-26 ENCOUNTER — Ambulatory Visit
Admission: RE | Admit: 2022-09-26 | Discharge: 2022-09-26 | Disposition: A | Discharge status: Home / Self Care | Payer: Medicare HMO | Source: Ambulatory Visit | Attending: Physical Medicine & Rehabilitation | Admitting: Physical Medicine & Rehabilitation

## 2022-09-26 DIAGNOSIS — M5414 Radiculopathy, thoracic region: Secondary | ICD-10-CM

## 2022-10-23 ENCOUNTER — Other Ambulatory Visit: Payer: Self-pay | Admitting: Urology

## 2022-10-23 DIAGNOSIS — N401 Enlarged prostate with lower urinary tract symptoms: Secondary | ICD-10-CM

## 2022-11-27 ENCOUNTER — Ambulatory Visit: Payer: Medicare HMO | Attending: Otolaryngology

## 2022-11-27 DIAGNOSIS — G4761 Periodic limb movement disorder: Secondary | ICD-10-CM | POA: Diagnosis not present

## 2022-11-27 DIAGNOSIS — R0683 Snoring: Secondary | ICD-10-CM | POA: Insufficient documentation

## 2022-11-27 DIAGNOSIS — Z6841 Body Mass Index (BMI) 40.0 and over, adult: Secondary | ICD-10-CM | POA: Insufficient documentation

## 2022-11-27 DIAGNOSIS — R0902 Hypoxemia: Secondary | ICD-10-CM | POA: Insufficient documentation

## 2022-11-27 DIAGNOSIS — I10 Essential (primary) hypertension: Secondary | ICD-10-CM | POA: Insufficient documentation

## 2022-11-27 DIAGNOSIS — G4733 Obstructive sleep apnea (adult) (pediatric): Secondary | ICD-10-CM | POA: Diagnosis not present

## 2022-11-27 DIAGNOSIS — I480 Paroxysmal atrial fibrillation: Secondary | ICD-10-CM | POA: Diagnosis not present

## 2022-12-17 ENCOUNTER — Other Ambulatory Visit: Payer: Self-pay | Admitting: General Surgery

## 2022-12-17 DIAGNOSIS — R1032 Left lower quadrant pain: Secondary | ICD-10-CM

## 2022-12-17 DIAGNOSIS — N63 Unspecified lump in unspecified breast: Secondary | ICD-10-CM

## 2022-12-18 ENCOUNTER — Ambulatory Visit
Admission: RE | Admit: 2022-12-18 | Discharge: 2022-12-18 | Disposition: A | Discharge status: Home / Self Care | Payer: Medicare HMO | Source: Ambulatory Visit | Attending: General Surgery | Admitting: General Surgery

## 2022-12-18 DIAGNOSIS — R1032 Left lower quadrant pain: Secondary | ICD-10-CM | POA: Diagnosis present

## 2022-12-18 MED ORDER — IOHEXOL 300 MG/ML  SOLN
100.0000 mL | Freq: Once | INTRAMUSCULAR | Status: AC | PRN
  Administered 2022-12-18: 100 mL via INTRAVENOUS

## 2022-12-18 MED ORDER — IOHEXOL 9 MG/ML PO SOLN
500.0000 mL | ORAL | Status: AC
  Administered 2022-12-18 (×2): 500 mL via ORAL

## 2022-12-25 ENCOUNTER — Other Ambulatory Visit: Payer: Self-pay

## 2022-12-25 ENCOUNTER — Encounter: Payer: Self-pay | Admitting: Internal Medicine

## 2023-01-01 ENCOUNTER — Encounter: Payer: Self-pay | Admitting: Internal Medicine

## 2023-01-01 ENCOUNTER — Encounter
Admission: RE | Disposition: A | Discharge status: Home / Self Care | Payer: Self-pay | Source: Home / Self Care | Attending: Internal Medicine

## 2023-01-01 ENCOUNTER — Ambulatory Visit: Payer: Medicare HMO | Admitting: Certified Registered"

## 2023-01-01 ENCOUNTER — Ambulatory Visit
Admission: RE | Admit: 2023-01-01 | Discharge: 2023-01-01 | Disposition: A | Discharge status: Home / Self Care | Payer: Medicare HMO | Attending: Internal Medicine | Admitting: Internal Medicine

## 2023-01-01 DIAGNOSIS — G8929 Other chronic pain: Secondary | ICD-10-CM | POA: Insufficient documentation

## 2023-01-01 DIAGNOSIS — D123 Benign neoplasm of transverse colon: Secondary | ICD-10-CM | POA: Diagnosis not present

## 2023-01-01 DIAGNOSIS — I4891 Unspecified atrial fibrillation: Secondary | ICD-10-CM | POA: Diagnosis not present

## 2023-01-01 DIAGNOSIS — G4730 Sleep apnea, unspecified: Secondary | ICD-10-CM | POA: Diagnosis not present

## 2023-01-01 DIAGNOSIS — Z6837 Body mass index (BMI) 37.0-37.9, adult: Secondary | ICD-10-CM | POA: Diagnosis not present

## 2023-01-01 DIAGNOSIS — K640 First degree hemorrhoids: Secondary | ICD-10-CM | POA: Diagnosis not present

## 2023-01-01 DIAGNOSIS — M549 Dorsalgia, unspecified: Secondary | ICD-10-CM | POA: Diagnosis not present

## 2023-01-01 DIAGNOSIS — Z7952 Long term (current) use of systemic steroids: Secondary | ICD-10-CM | POA: Diagnosis not present

## 2023-01-01 DIAGNOSIS — Z7984 Long term (current) use of oral hypoglycemic drugs: Secondary | ICD-10-CM | POA: Insufficient documentation

## 2023-01-01 DIAGNOSIS — E119 Type 2 diabetes mellitus without complications: Secondary | ICD-10-CM | POA: Diagnosis not present

## 2023-01-01 DIAGNOSIS — Z87891 Personal history of nicotine dependence: Secondary | ICD-10-CM | POA: Diagnosis not present

## 2023-01-01 DIAGNOSIS — R1310 Dysphagia, unspecified: Secondary | ICD-10-CM | POA: Diagnosis not present

## 2023-01-01 DIAGNOSIS — Z1211 Encounter for screening for malignant neoplasm of colon: Secondary | ICD-10-CM | POA: Diagnosis present

## 2023-01-01 DIAGNOSIS — Z79899 Other long term (current) drug therapy: Secondary | ICD-10-CM | POA: Insufficient documentation

## 2023-01-01 DIAGNOSIS — D124 Benign neoplasm of descending colon: Secondary | ICD-10-CM | POA: Diagnosis not present

## 2023-01-01 DIAGNOSIS — N400 Enlarged prostate without lower urinary tract symptoms: Secondary | ICD-10-CM | POA: Diagnosis not present

## 2023-01-01 DIAGNOSIS — K2970 Gastritis, unspecified, without bleeding: Secondary | ICD-10-CM | POA: Insufficient documentation

## 2023-01-01 DIAGNOSIS — J45909 Unspecified asthma, uncomplicated: Secondary | ICD-10-CM | POA: Diagnosis not present

## 2023-01-01 DIAGNOSIS — I10 Essential (primary) hypertension: Secondary | ICD-10-CM | POA: Diagnosis not present

## 2023-01-01 DIAGNOSIS — Z860101 Personal history of adenomatous and serrated colon polyps: Secondary | ICD-10-CM | POA: Diagnosis not present

## 2023-01-01 DIAGNOSIS — K219 Gastro-esophageal reflux disease without esophagitis: Secondary | ICD-10-CM | POA: Insufficient documentation

## 2023-01-01 HISTORY — PX: ESOPHAGOGASTRODUODENOSCOPY (EGD) WITH PROPOFOL: SHX5813

## 2023-01-01 HISTORY — PX: POLYPECTOMY: SHX5525

## 2023-01-01 HISTORY — PX: MALONEY DILATION: SHX5535

## 2023-01-01 HISTORY — PX: COLONOSCOPY WITH PROPOFOL: SHX5780

## 2023-01-01 LAB — GLUCOSE, CAPILLARY: Glucose-Capillary: 124 mg/dL — ABNORMAL HIGH (ref 70–99)

## 2023-01-01 SURGERY — COLONOSCOPY WITH PROPOFOL
Anesthesia: General

## 2023-01-01 MED ORDER — GLYCOPYRROLATE 0.2 MG/ML IJ SOLN
INTRAMUSCULAR | Status: DC | PRN
  Administered 2023-01-01: .2 mg via INTRAVENOUS

## 2023-01-01 MED ORDER — PROPOFOL 10 MG/ML IV BOLUS
INTRAVENOUS | Status: DC | PRN
  Administered 2023-01-01: 100 mg via INTRAVENOUS

## 2023-01-01 MED ORDER — LIDOCAINE HCL (CARDIAC) PF 100 MG/5ML IV SOSY
PREFILLED_SYRINGE | INTRAVENOUS | Status: DC | PRN
  Administered 2023-01-01: 100 mg via INTRAVENOUS

## 2023-01-01 MED ORDER — DEXMEDETOMIDINE HCL IN NACL 80 MCG/20ML IV SOLN
INTRAVENOUS | Status: DC | PRN
  Administered 2023-01-01: 8 ug via INTRAVENOUS

## 2023-01-01 MED ORDER — PROPOFOL 500 MG/50ML IV EMUL
INTRAVENOUS | Status: DC | PRN
  Administered 2023-01-01: 150 ug/kg/min via INTRAVENOUS

## 2023-01-01 MED ORDER — SODIUM CHLORIDE 0.9 % IV SOLN
INTRAVENOUS | Status: DC
  Administered 2023-01-01: 500 mL via INTRAVENOUS

## 2023-01-01 MED ORDER — PROPOFOL 1000 MG/100ML IV EMUL
INTRAVENOUS | Status: AC
  Filled 2023-01-01: qty 100

## 2023-01-01 NOTE — Op Note (Signed)
[facility] Gastroenterology Patient Name: Ronnie Cunningham Procedure Date: 01/01/2023 7:24 AM MRN: [id number] Account #: [id number] Date of Birth: [birth year] Admit Type: Outpatient Age: 72 Room: [facility] ENDO ROOM 2 Gender: Male Note Status: Finalized Instrument Name: Prentice Docker [id number] Procedure:             Colonoscopy Indications:           Surveillance: Personal history of adenomatous polyps                         on last colonoscopy > 5 years ago Providers:             Royce Macadamia K. [name] MD, MD Referring MD:          [name], MD (Referring MD) Medicines:             Propofol per Anesthesia Complications:         No immediate complications. Procedure:             Pre-Anesthesia Assessment:                        - The risks and benefits of the procedure and the                         sedation options and risks were discussed with the                         patient. All questions were answered and informed                         consent was obtained.                        - Patient identification and proposed procedure were                         verified prior to the procedure by the nurse. The                         procedure was verified in the procedure room.                        - ASA Grade Assessment: III - A patient with severe                         systemic disease.                        - After reviewing the risks and benefits, the patient                         was deemed in satisfactory condition to undergo the                         procedure.                        After obtaining informed consent, the colonoscope was  passed under direct vision. Throughout the procedure,                         the patient's blood pressure, pulse, and oxygen                         saturations were monitored continuously. The                         Colonoscope was introduced through the anus and                          advanced to the the cecum, identified by appendiceal                         orifice and ileocecal valve. The colonoscopy was                         performed without difficulty. The patient tolerated                         the procedure well. The quality of the bowel                         preparation was good. The ileocecal valve, appendiceal                         orifice, and rectum were photographed. Findings:      The perianal and digital rectal examinations were normal. Pertinent       negatives include normal sphincter tone and no palpable rectal lesions.      Non-bleeding internal hemorrhoids were found during retroflexion. The       hemorrhoids were Grade I (internal hemorrhoids that do not prolapse).      Two sessile polyps were found in the descending colon and transverse       colon. The polyps were 4 to 5 mm in size. These polyps were removed with       a piecemeal technique using a cold snare. Resection and retrieval were       complete.      The exam was otherwise without abnormality. Impression:            - Non-bleeding internal hemorrhoids.                        - Two 4 to 5 mm polyps in the descending colon and in                         the transverse colon, removed piecemeal using a cold                         snare. Resected and retrieved.                        - The examination was otherwise normal. Recommendation:        - Patient has a contact number available for                         emergencies. The signs  and symptoms of potential                         delayed complications were discussed with the patient.                         Return to normal activities tomorrow. Written                         discharge instructions were provided to the patient.                        - Monitor results to esophageal dilation                        - Resume previous diet.                        - Continue present medications.                        - Repeat  colonoscopy is recommended for surveillance.                         The colonoscopy date will be determined after                         pathology results from today's exam become available                         for review.                        - Return to GI office in 3 months.                        - Telephone GI office to schedule appointment.                        - The findings and recommendations were discussed with                         the patient. Procedure Code(s):     --- Professional ---                        [phone], Colonoscopy, flexible; with removal of                         tumor(s), polyp(s), or other lesion(s) by snare                         technique Diagnosis Code(s):     --- Professional ---                        K64.0, First degree hemorrhoids                        D12.3, Benign neoplasm of transverse colon (hepatic  flexure or splenic flexure)                        D12.4, Benign neoplasm of descending colon                        Z86.010, Personal history of colonic polyps CPT copyright 2022 American [employer]. All rights reserved. The codes documented in this report are preliminary and upon coder review may  be revised to meet current compliance requirements. Stanton Kidney MD, MD 01/01/2023 9:04:30 AM This report has been signed electronically. Number of Addenda: 0 Note Initiated On: 01/01/2023 7:24 AM Scope Withdrawal Time: 0 hours 7 minutes 16 seconds  Total Procedure Duration: 0 hours 13 minutes 46 seconds  Estimated Blood Loss:  Estimated blood loss: none.      [facility]

## 2023-01-01 NOTE — Transfer of Care (Signed)
Immediate Anesthesia Transfer of Care Note  Patient: Ronnie Cunningham  Procedure(s) Performed: COLONOSCOPY WITH PROPOFOL ESOPHAGOGASTRODUODENOSCOPY (EGD) WITH PROPOFOL MALONEY DILATION POLYPECTOMY  Patient Location: PACU and Endoscopy Unit  Anesthesia Type:General  Level of Consciousness: drowsy  Airway & Oxygen Therapy: Patient Spontanous Breathing  Post-op Assessment: Report given to RN and Post -op Vital signs reviewed and stable  Post vital signs: Reviewed and stable  Last Vitals:  Vitals Value Taken Time  BP 127/65 01/01/23 0904  Temp 36.1 C 01/01/23 0904  Pulse 68 01/01/23 0906  Resp 18 01/01/23 0904  SpO2 98 % 01/01/23 0906  Vitals shown include unfiled device data.  Last Pain:  Vitals:   01/01/23 0904  TempSrc: Temporal  PainSc: 0-No pain         Complications: No notable events documented.

## 2023-01-01 NOTE — Anesthesia Procedure Notes (Signed)
Procedure Name: MAC Date/Time: 01/01/2023 8:32 AM  Performed by: Cheral Bay, CRNAPre-anesthesia Checklist: Patient identified, Emergency Drugs available, Suction available, Patient being monitored and Timeout performed Patient Re-evaluated:Patient Re-evaluated prior to induction Oxygen Delivery Method: Non-rebreather mask Induction Type: IV induction Placement Confirmation: positive ETCO2 and CO2 detector

## 2023-01-01 NOTE — Anesthesia Preprocedure Evaluation (Addendum)
Anesthesia Evaluation  Patient identified by MRN, date of birth, ID band Patient awake    Reviewed: Allergy & Precautions, NPO status , Patient's Chart, lab work & pertinent test results  Airway Mallampati: III       Dental  (+) Loose,    Pulmonary asthma , sleep apnea , former smoker   Pulmonary exam normal        Cardiovascular Exercise Tolerance: Good hypertension, Normal cardiovascular exam+ dysrhythmias Atrial Fibrillation      Neuro/Psych  Neuromuscular disease  negative psych ROS   GI/Hepatic Neg liver ROS,GERD  Controlled,,dysphagia   Endo/Other  diabetes  Morbid obesity  Renal/GU negative Renal ROS  negative genitourinary   Musculoskeletal negative musculoskeletal ROS (+)    Abdominal  (+) + obese  Peds negative pediatric ROS (+)  Hematology negative hematology ROS (+)   Anesthesia Other Findings Past Medical History: No date: Afib (HCC) No date: Asthma No date: BPH (benign prostatic hyperplasia) No date: Diabetes mellitus without complication (HCC) No date: GERD (gastroesophageal reflux disease) No date: Hypertension No date: Morbid obesity (HCC) No date: Seasonal allergies No date: Sleep apnea 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  Reproductive/Obstetrics                             Anesthesia Physical Anesthesia Plan  ASA: 3  Anesthesia Plan: General   Post-op Pain Management:    Induction: Intravenous  PONV Risk Score and Plan: Propofol infusion  Airway Management Planned: Nasal Cannula  Additional Equipment:   Intra-op Plan:   Post-operative Plan:   Informed Consent: I have reviewed the patients History and Physical, chart, labs and discussed the procedure including the risks, benefits and alternatives for the proposed anesthesia with the patient or authorized representative who has indicated his/her understanding and acceptance.     Dental advisory  given  Plan Discussed with: CRNA and Surgeon  Anesthesia Plan Comments: (Pt reports having a loose upper bridge. The risks of damage were discussed. )        Anesthesia Quick Evaluation

## 2023-01-01 NOTE — Anesthesia Postprocedure Evaluation (Signed)
Anesthesia Post Note  Patient: IKE ANDRADA  Procedure(s) Performed: COLONOSCOPY WITH PROPOFOL ESOPHAGOGASTRODUODENOSCOPY (EGD) WITH PROPOFOL MALONEY DILATION POLYPECTOMY  Patient location during evaluation: Endoscopy Anesthesia Type: General Level of consciousness: awake and alert Pain management: pain level controlled Vital Signs Assessment: post-procedure vital signs reviewed and stable Respiratory status: spontaneous breathing, nonlabored ventilation and respiratory function stable Cardiovascular status: blood pressure returned to baseline and stable Postop Assessment: no apparent nausea or vomiting Anesthetic complications: no   No notable events documented.   Last Vitals:  Vitals:   01/01/23 0914 01/01/23 0924  BP: 122/82 121/65  Pulse: 70 70  Resp: 20 20  Temp:    SpO2: 99% 100%    Last Pain:  Vitals:   01/01/23 0924  TempSrc:   PainSc: 0-No pain                 Foye Deer

## 2023-01-01 NOTE — H&P (Signed)
Outpatient short stay form Pre-procedure 01/01/2023 8:29 AM Ronnie Cunningham, M.D.  Primary Physician: [name], M.D.  Reason for visit:  Personal history of colon polyps, GERD, Dysphagia  History of present illness:  Patient main complaint is that he is concerned of having another episode of diverticulitis. He has been having left flank pain that radiates to the left lower quadrant. Patient cannot identify any alleviating or aggravating factors. This left lower quadrant pain is associated with bloating. Denies any fever.  He is also having discomfort in the periumbilical area. This is associated with the hernia. He endorses that the hernia is tender to palpation. No pain radiation from the hernia. Pain aggravated by applying pressure. Alleviating factor is resting.  Patient also complaining of back pain. Back mostly on the left side. Patient has history of chronic back pain treated with injections.  Patient also mentioned that he has been feeling a palpable mass in the 3 o'clock position of the left breast. No specific pain on the left breast. No pain radiation. Patient denies any skin changes. Patient has a history of multiple first-degree family member with breast cancer at early age.  Patient also concern of the left leg pain and left leg pain could be kidney stones. As per patient he had passed "soft stones from his left kidney" in the past.  Patient denies right upper quadrant pain. Patient denies pain after eating.     Current Facility-Administered Medications:    0.9 %  sodium chloride infusion, , Intravenous, Continuous, [place], Boykin Nearing, MD, Last Rate: 20 mL/hr at 01/01/23 0756, 500 mL at 01/01/23 0756  Medications Prior to Admission  Medication Sig Dispense Refill Last Dose   albuterol (VENTOLIN HFA) 108 (90 Base) MCG/ACT inhaler Inhale 2 puffs into the lungs every 4 (four) hours as needed for wheezing or shortness of breath. Inhale into the lungs. 6.7 g 0 Past Month    atorvastatin (LIPITOR) 20 MG tablet Take 20 mg by mouth at bedtime.    Past Week   baclofen (LIORESAL) 10 MG tablet Take 1 tablet by mouth 2 (two) times daily.   Past Week   Coenzyme Q10 (CO Q 10) 100 MG CAPS Take 2 capsules by mouth daily.    Past Week   diltiazem (CARDIZEM CD) 120 MG 24 hr capsule Take 120 mg by mouth daily.   12/31/2022   escitalopram (LEXAPRO) 20 MG tablet Take 20 mg by mouth daily.    Past Week   finasteride (PROSCAR) 5 MG tablet Take 1 tablet (5 mg total) by mouth daily. 90 tablet 3 Past Week   fluticasone (FLONASE) 50 MCG/ACT nasal spray Place 2 sprays into both nostrils daily.    Past Week   gabapentin (NEURONTIN) 300 MG capsule Take 1 capsule by mouth 2 (two) times daily.   Past Week   ipratropium (ATROVENT) 0.06 % nasal spray Place 2 sprays into both nostrils 4 (four) times daily. 15 mL 0 Past Week   loratadine (CLARITIN) 10 MG tablet Take 10 mg by mouth daily.   Past Week   metFORMIN (GLUCOPHAGE) 1000 MG tablet Take 1,000 mg by mouth 2 (two) times daily.   Past Week   metoprolol tartrate (LOPRESSOR) 25 MG tablet Take 1 tablet (25 mg total) by mouth 2 (two) times daily. 60 tablet 2 12/31/2022   montelukast (SINGULAIR) 10 MG tablet Take 10 mg by mouth daily as needed.    Past Week   Multiple Vitamin (MULTIVITAMIN WITH MINERALS) TABS tablet Take 1  tablet by mouth daily.   Past Week   pantoprazole (PROTONIX) 40 MG tablet Take 40 mg by mouth daily.    Past Week   predniSONE (STERAPRED UNI-PAK 21 TAB) 10 MG (21) TBPK tablet Take by mouth daily. Take 6 tabs by mouth daily for 1, then 5 tabs for 1 day, then 4 tabs for 1 day, then 3 tabs for 1 day, then 2 tabs for 1 day, then 1 tab for 1 day. 21 tablet 0 Past Week   tamsulosin (FLOMAX) 0.4 MG CAPS capsule TAKE 1 CAPSULE BY MOUTH EVERY DAY AFTER SUPPER 90 capsule 3 Past Week   traMADol (ULTRAM) 50 MG tablet Take 50 mg by mouth 2 (two) times daily as needed.   Past Week   ELIQUIS 5 MG TABS tablet SMARTSIG:1 Tablet(s) By Mouth  Every 12 Hours   12/28/2022   glucose blood (ONETOUCH ULTRA) test strip USE ONE DAILY      ipratropium (ATROVENT) 0.06 % nasal spray Place into the nose.      mirtazapine (REMERON) 15 MG tablet Take by mouth.      OneTouch Delica Lancets 33G MISC TEST BG 3 TIMES DAILY AS DIRECTED      Semaglutide,0.25 or 0.5MG /DOS, (OZEMPIC, 0.25 OR 0.5 MG/DOSE,) 2 MG/1.5ML SOPN INJECT 0.375 MLS (0.5 MG TOTAL) SUBCUTANEOUSLY ONCE A WEEK FOR 180 DAYS   12/24/2022     Allergies  Allergen Reactions   Doxycycline Nausea Only    GI upset     Pollen Extract Other (See Comments)   Tape Other (See Comments)     Past Medical History:  Diagnosis Date   Afib (HCC)    Asthma    BPH (benign prostatic hyperplasia)    Diabetes mellitus without complication (HCC)    GERD (gastroesophageal reflux disease)    Hypertension    Morbid obesity (HCC)    Seasonal allergies    Sleep apnea    Spasmodic dysphonia     Review of systems:  Otherwise negative.    Physical Exam  Gen: Alert, oriented. Appears stated age.  HEENT: [place]/AT. PERRLA. Lungs: CTA, no wheezes. CV: RR nl S1, S2. Abd: soft, benign, no masses. BS+ Ext: No edema. Pulses 2+    Planned procedures: Proceed with EGD and colonoscopy. The patient understands the nature of the planned procedure, indications, risks, alternatives and potential complications including but not limited to bleeding, infection, perforation, damage to internal organs and possible oversedation/side effects from anesthesia. The patient agrees and gives consent to proceed.  Please refer to procedure notes for findings, recommendations and patient disposition/instructions.     Murrell Elizondo K. Norma Cunningham, M.D. Gastroenterology 01/01/2023  8:29 AM

## 2023-01-01 NOTE — Interval H&P Note (Signed)
History and Physical Interval Note:  01/01/2023 8:32 AM  Ronnie Cunningham  has presented today for surgery, with the diagnosis of h/o ta polyp gerd dysphagiac.  The various methods of treatment have been discussed with the patient and family. After consideration of risks, benefits and other options for treatment, the patient has consented to  Procedure(s): COLONOSCOPY WITH PROPOFOL (N/A) ESOPHAGOGASTRODUODENOSCOPY (EGD) WITH PROPOFOL (N/A) as a surgical intervention.  The patient's history has been reviewed, patient examined, no change in status, stable for surgery.  I have reviewed the patient's chart and labs.  Questions were answered to the patient's satisfaction.     [place], [place]

## 2023-01-01 NOTE — Op Note (Signed)
[facility] Gastroenterology Patient Name: Ronnie Cunningham Procedure Date: 01/01/2023 7:25 AM MRN: [id number] Account #: [id number] Date of Birth: 11-10-1950 Admit Type: Outpatient Age: 71 Room: [facility] ENDO ROOM 2 Gender: Male Note Status: Finalized Instrument Name: Upper Endoscope [id number] Procedure:             Upper GI endoscopy Indications:           Dysphagia, Gastro-esophageal reflux disease Providers:             Boykin Nearing. [name] MD, MD Referring MD:          [name], MD (Referring MD) Medicines:             Propofol per Anesthesia Complications:         No immediate complications. Procedure:             Pre-Anesthesia Assessment:                        - The risks and benefits of the procedure and the                         sedation options and risks were discussed with the                         patient. All questions were answered and informed                         consent was obtained.                        - Patient identification and proposed procedure were                         verified prior to the procedure by the nurse. The                         procedure was verified in the procedure room.                        - ASA Grade Assessment: III - A patient with severe                         systemic disease.                        - After reviewing the risks and benefits, the patient                         was deemed in satisfactory condition to undergo the                         procedure.                        After obtaining informed consent, the endoscope was                         passed under direct vision. Throughout the procedure,  the patient's blood pressure, pulse, and oxygen                         saturations were monitored continuously. The Endoscope                         was introduced through the mouth, and advanced to the                         third part of duodenum. The upper GI  endoscopy was                         accomplished without difficulty. The patient tolerated                         the procedure well. Findings:      Normal mucosa was found in the entire esophagus.      No endoscopic abnormality was evident in the esophagus to explain the       patient's complaint of dysphagia. It was decided, however, to proceed       with dilation in the distal esophagus. The scope was withdrawn. Dilation       was performed with a Maloney dilator.      Patchy mild inflammation characterized by congestion (edema) and       erythema was found in the gastric antrum.      The cardia and gastric fundus were normal on retroflexion.      The examined duodenum was normal. Impression:            - Normal mucosa was found in the entire esophagus.                        - No endoscopic esophageal abnormality to explain                         patient's dysphagia. Esophagus dilated. Dilated.                        - Gastritis.                        - Normal examined duodenum.                        - No specimens collected. Recommendation:        - Monitor results to esophageal dilation                        - Proceed with colonoscopy Procedure Code(s):     --- Professional ---                        [phone], Esophagogastroduodenoscopy, flexible,                         transoral; diagnostic, including collection of                         specimen(s) by brushing or washing, when performed                         (  separate procedure)                        43450, Dilation of esophagus, by unguided sound or                         bougie, single or multiple passes Diagnosis Code(s):     --- Professional ---                        K21.9, Gastro-esophageal reflux disease without                         esophagitis                        K29.70, Gastritis, unspecified, without bleeding                        R13.10, Dysphagia, unspecified CPT copyright 2022 American Medical  Association. All rights reserved. The codes documented in this report are preliminary and upon coder review may  be revised to meet current compliance requirements. Stanton Kidney MD, MD 01/01/2023 8:45:03 AM This report has been signed electronically. Number of Addenda: 0 Note Initiated On: 01/01/2023 7:25 AM Estimated Blood Loss:  Estimated blood loss: none.      [facility]

## 2023-01-02 ENCOUNTER — Encounter: Payer: Self-pay | Admitting: Internal Medicine

## 2023-01-02 LAB — SURGICAL PATHOLOGY

## 2023-01-06 ENCOUNTER — Ambulatory Visit
Admission: RE | Admit: 2023-01-06 | Discharge: 2023-01-06 | Disposition: A | Discharge status: Home / Self Care | Payer: Medicare HMO | Source: Ambulatory Visit | Attending: General Surgery | Admitting: General Surgery

## 2023-01-06 ENCOUNTER — Other Ambulatory Visit: Payer: Self-pay | Admitting: General Surgery

## 2023-01-06 DIAGNOSIS — N6321 Unspecified lump in the left breast, upper outer quadrant: Secondary | ICD-10-CM | POA: Diagnosis not present

## 2023-01-06 DIAGNOSIS — N63 Unspecified lump in unspecified breast: Secondary | ICD-10-CM | POA: Diagnosis present

## 2023-01-06 DIAGNOSIS — N6311 Unspecified lump in the right breast, upper outer quadrant: Secondary | ICD-10-CM | POA: Insufficient documentation

## 2023-03-07 ENCOUNTER — Ambulatory Visit
Admission: EM | Admit: 2023-03-07 | Discharge: 2023-03-07 | Disposition: A | Discharge status: Home / Self Care | Payer: Medicare HMO | Attending: Emergency Medicine | Admitting: Emergency Medicine

## 2023-03-07 DIAGNOSIS — J0101 Acute recurrent maxillary sinusitis: Secondary | ICD-10-CM | POA: Diagnosis not present

## 2023-03-07 DIAGNOSIS — J22 Unspecified acute lower respiratory infection: Secondary | ICD-10-CM

## 2023-03-07 MED ORDER — LEVOFLOXACIN 750 MG PO TABS
750.0000 mg | ORAL_TABLET | Freq: Every day | ORAL | 0 refills | Status: AC
Start: 2023-03-07 — End: 2023-03-12

## 2023-03-07 NOTE — ED Provider Notes (Signed)
 [facility]    CSN: [id number] Arrival date & time: 03/07/23  1118      History   Chief Complaint Chief Complaint  Patient presents with   Nasal Congestion    HPI Ronnie Cunningham is a 73 y.o. male.   Ronnie Cunningham, 73 year old male, presents to urgent care for evaluation of nasal congestion/ sore throat for 2 weeks.  Patient states sometimes his drainage is green, he also complains of headache and bilateral ear pain/fullness, states he was treated for sinus infection in November with Augmentin  and steroids, got a little bit better but did not completely improve.  Patient states since finishing the antibiotic symptoms in November has been having intermittent but persistent symptoms over the last 2 weeks.  Patient states he has seen [facility] in the past and has taken Astelin nasal spray without relief.  Past medical history of diabetes, hypertension, GERD, A-fib, sleep apnea    The history is provided by the patient. No language interpreter was used.    Past Medical History:  Diagnosis Date   Afib (HCC)    Asthma    BPH (benign prostatic hyperplasia)    Diabetes mellitus without complication (HCC)    GERD (gastroesophageal reflux disease)    Hypertension    Morbid obesity (HCC)    Seasonal allergies    Sleep apnea    Spasmodic dysphonia     Patient Active Problem List   Diagnosis Date Noted   Acute respiratory infection 03/07/2023   Atrial fibrillation with RVR (HCC) 10/28/2016   UTI (urinary tract infection) 10/26/2016   Morbid (severe) obesity due to excess calories (HCC) 04/12/2015   Morbid obesity (HCC) 04/12/2015   Borderline diabetes mellitus 02/22/2015   BP (high blood pressure) 02/16/2014   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   Abnormal blood sugar 09/15/2013   Chronic nonalcoholic liver disease 09/15/2013   Essential (primary) hypertension 09/15/2013   Combined fat and carbohydrate induced hyperlipemia 09/15/2013   H/O neoplasm 10/27/2012    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   Obstructive apnea 02/21/2012   Controlled type 2 diabetes mellitus without complication (HCC) 10/03/2011   Type 2 diabetes mellitus (HCC) 10/03/2011   Acute antritis 07/23/2011   Cough 07/23/2011   HLD (hyperlipidemia) 07/23/2011   Difficult or painful urination 06/11/2011   Actinic keratoses 06/01/2010    Past Surgical History:  Procedure Laterality Date   ADENOIDECTOMY     CARDIAC CATHETERIZATION     CARDIAC SURGERY     COLONOSCOPY WITH PROPOFOL  N/A 05/10/2019   Procedure: COLONOSCOPY WITH PROPOFOL ;  Surgeon: Toledo, Ladell POUR, MD;  Location: [facility];  Service: Gastroenterology;  Laterality: N/A;   COLONOSCOPY WITH PROPOFOL  N/A 01/01/2023   Procedure: COLONOSCOPY WITH PROPOFOL ;  Surgeon: Toledo, Ladell POUR, MD;  Location: [facility];  Service: Gastroenterology;  Laterality: N/A;   ESOPHAGOGASTRODUODENOSCOPY (EGD) WITH PROPOFOL  N/A 05/10/2019   Procedure: ESOPHAGOGASTRODUODENOSCOPY (EGD) WITH PROPOFOL ;  Surgeon: Toledo, Ladell POUR, MD;  Location: [facility];  Service: Gastroenterology;  Laterality: N/A;   ESOPHAGOGASTRODUODENOSCOPY (EGD) WITH PROPOFOL  N/A 01/01/2023   Procedure: ESOPHAGOGASTRODUODENOSCOPY (EGD) WITH PROPOFOL ;  Surgeon: Toledo, Ladell POUR, MD;  Location: [facility];  Service: Gastroenterology;  Laterality: N/A;   MALONEY DILATION  01/01/2023   Procedure: MALONEY DILATION;  Surgeon: Aundria, Ladell POUR, MD;  Location: [facility];  Service: Gastroenterology;;   POLYPECTOMY  01/01/2023   Procedure: POLYPECTOMY;  Surgeon: Toledo, Teodoro K, MD;  Location: [facility];  Service: Gastroenterology;;   TONSILLECTOMY  Home Medications    Prior to Admission medications   Medication Sig Start Date End Date Taking? Authorizing Provider  albuterol  (VENTOLIN  HFA) 108 (90 Base) MCG/ACT inhaler Inhale 2 puffs into the lungs every 4 (four) hours as needed for wheezing or shortness of breath. Inhale into the lungs. 06/12/22    Brimage, Vondra, DO  atorvastatin  (LIPITOR) 20 MG tablet Take 20 mg by mouth at bedtime.  04/01/12   [provider]  baclofen (LIORESAL) 10 MG tablet Take 1 tablet by mouth 2 (two) times daily. 01/31/20   [provider]  Coenzyme Q10 (CO Q 10) 100 MG CAPS Take 2 capsules by mouth daily.     [provider]  diltiazem  (CARDIZEM  CD) 120 MG 24 hr capsule Take 120 mg by mouth daily. 04/14/21   [provider]  ELIQUIS 5 MG TABS tablet SMARTSIG:1 Tablet(s) By Mouth Every 12 Hours 02/22/19   [provider]  escitalopram  (LEXAPRO ) 20 MG tablet Take 20 mg by mouth daily.  04/05/14   [provider]  finasteride  (PROSCAR ) 5 MG tablet Take 1 tablet (5 mg total) by mouth daily. 05/16/22   Francisca Redell BROCKS, MD  fluticasone  (FLONASE ) 50 MCG/ACT nasal spray Place 2 sprays into both nostrils daily.  12/08/14   [provider]  gabapentin (NEURONTIN) 300 MG capsule Take 1 capsule by mouth 2 (two) times daily. 05/09/21   [provider]  glucose blood (ONETOUCH ULTRA) test strip USE ONE DAILY 07/06/18   [provider]  ipratropium (ATROVENT ) 0.06 % nasal spray Place into the nose. 10/12/20 10/12/21  [provider]  ipratropium (ATROVENT ) 0.06 % nasal spray Place 2 sprays into both nostrils 4 (four) times daily. 08/30/21   Arvis Jolan NOVAK, PA-C  levofloxacin  (LEVAQUIN ) 750 MG tablet Take 1 tablet (750 mg total) by mouth daily for 5 days. 03/07/23 03/12/23 Yes Gennesis Hogland, NP  loratadine  (CLARITIN ) 10 MG tablet Take 10 mg by mouth daily.    [provider]  metFORMIN  (GLUCOPHAGE ) 1000 MG tablet Take 1,000 mg by mouth 2 (two) times daily. 03/18/19   [provider]  metoprolol  tartrate (LOPRESSOR ) 25 MG tablet Take 1 tablet (25 mg total) by mouth 2 (two) times daily. 10/29/16   Sherial Bail, MD  mirtazapine (REMERON) 15 MG tablet Take by mouth. 05/09/21 11/05/21  [provider]  montelukast  (SINGULAIR ) 10  MG tablet Take 10 mg by mouth daily as needed.  05/30/15   [provider]  Multiple Vitamin (MULTIVITAMIN WITH MINERALS) TABS tablet Take 1 tablet by mouth daily.    [provider]  OneTouch Delica Lancets 33G MISC TEST BG 3 TIMES DAILY AS DIRECTED 12/30/16   [provider]  pantoprazole  (PROTONIX ) 40 MG tablet Take 40 mg by mouth daily.  04/13/15   [provider]  predniSONE  (STERAPRED UNI-PAK 21 TAB) 10 MG (21) TBPK tablet Take by mouth daily. Take 6 tabs by mouth daily for 1, then 5 tabs for 1 day, then 4 tabs for 1 day, then 3 tabs for 1 day, then 2 tabs for 1 day, then 1 tab for 1 day. 06/12/22   Brimage, Vondra, DO  Semaglutide,0.25 or 0.5MG /DOS, (OZEMPIC, 0.25 OR 0.5 MG/DOSE,) 2 MG/1.5ML SOPN INJECT 0.375 MLS (0.5 MG TOTAL) SUBCUTANEOUSLY ONCE A WEEK FOR 180 DAYS 02/08/21   [provider]  tamsulosin  (FLOMAX ) 0.4 MG CAPS capsule TAKE 1 CAPSULE BY MOUTH EVERY DAY AFTER SUPPER 10/23/22   Francisca Redell BROCKS, MD  traMADol  (ULTRAM )  50 MG tablet Take 50 mg by mouth 2 (two) times daily as needed. 03/09/21   [provider]    Family History Family History  Problem Relation Age of Onset   Breast cancer Mother    Cancer Mother    Hypertension Mother    Cancer Father    Hypertension Father    Breast cancer Sister     Social History Social History   Tobacco Use   Smoking status: Former   Smokeless tobacco: Never  Vaping Use   Vaping status: Never Used  Substance Use Topics   Alcohol use: Yes    Alcohol/week: 0.0 standard drinks of alcohol   Drug use: No     Allergies   Doxycycline, Pollen extract, and Tape   Review of Systems Review of Systems  Constitutional:  Negative for fever.  HENT:  Positive for congestion, ear pain, sinus pressure, sinus pain and sore throat.   Neurological:  Positive for headaches.  All other systems reviewed and are negative.    Physical Exam Triage Vital Signs ED Triage Vitals [03/07/23 1155]   Encounter Vitals Group     BP 134/81     Systolic BP Percentile      Diastolic BP Percentile      Pulse Rate 70     Resp 18     Temp 98.3 F (36.8 C)     Temp Source Oral     SpO2 99 %     Weight      Height      Head Circumference      Peak Flow      Pain Score      Pain Loc      Pain Education      Exclude from Growth Chart    No data found.  Updated Vital Signs BP 134/81 (BP Location: Left Arm)   Pulse 70   Temp 98.3 F (36.8 C) (Oral)   Resp 18   SpO2 99%   Visual Acuity Right Eye Distance:   Left Eye Distance:   Bilateral Distance:    Right Eye Near:   Left Eye Near:    Bilateral Near:     Physical Exam Vitals and nursing note reviewed.  Constitutional:      General: He is not in acute distress.    Appearance: He is well-developed. He is not ill-appearing or toxic-appearing.  HENT:     Head: Normocephalic.     Right Ear: Tympanic membrane is retracted.     Left Ear: Tympanic membrane is retracted.     Nose: Mucosal edema and congestion present.     Mouth/Throat:     Mouth: Mucous membranes are moist.     Pharynx: Uvula midline.  Eyes:     General: Lids are normal.     Conjunctiva/sclera: Conjunctivae normal.     Pupils: Pupils are equal, round, and reactive to light.  Cardiovascular:     Rate and Rhythm: Normal rate and regular rhythm.     Heart sounds: Normal heart sounds.  Pulmonary:     Effort: Pulmonary effort is normal. No respiratory distress.     Breath sounds: Normal breath sounds and air entry. No decreased breath sounds or wheezing.  Abdominal:     General: There is no distension.     Palpations: Abdomen is soft.  Musculoskeletal:        General: Normal range of motion.     Cervical back: Normal range of motion.  Skin:  General: Skin is warm and dry.     Findings: No rash.  Neurological:     General: No focal deficit present.     Mental Status: He is alert and oriented to person, place, and time.     GCS: GCS eye subscore is  4. GCS verbal subscore is 5. GCS motor subscore is 6.     Cranial Nerves: No cranial nerve deficit.     Sensory: No sensory deficit.  Psychiatric:        Attention and Perception: Attention normal.        Mood and Affect: Mood normal.        Speech: Speech normal.        Behavior: Behavior normal. Behavior is cooperative.      UC Treatments / Results  Labs (all labs ordered are listed, but only abnormal results are displayed) Labs Reviewed - No data to display  EKG   Radiology No results found.  Procedures Procedures (including critical care time)  Medications Ordered in UC Medications - No data to display  Initial Impression / Assessment and Plan / UC Course  I have reviewed the triage vital signs and the nursing notes.  Pertinent labs & imaging results that were available during my care of the patient were reviewed by me and considered in my medical decision making (see chart for details).    Discussed exam findings and plan of care with patient, strict go to ER precautions given.   Patient verbalized understanding to this provider.  Ddx: Acute respiratory infection, acute recurrent maxillary sinusitis, allergies, viral illness Final Clinical Impressions(s) / UC Diagnoses   Final diagnoses:  Acute recurrent maxillary sinusitis  Acute respiratory infection     Discharge Instructions      Take Levaquin  as prescribed.  Drink plenty of water , follow-up with your PCP/ENT at [facility] if symptoms persist.  If you develop new or worsening symptoms(chest pain shortness of breath palpitations go to the emergency room for further evaluation).     ED Prescriptions     Medication Sig Dispense Auth. Provider   levofloxacin  (LEVAQUIN ) 750 MG tablet Take 1 tablet (750 mg total) by mouth daily for 5 days. 5 tablet Zamarian Scarano, Rilla, NP      PDMP not reviewed this encounter.   Aminta Rilla, NP 03/07/23 1255

## 2023-03-07 NOTE — Discharge Instructions (Signed)
 Take Levaquin as prescribed.  Drink plenty of water , follow-up with your PCP/ENT at [facility] if symptoms persist.  If you develop new or worsening symptoms(chest pain shortness of breath palpitations go to the emergency room for further evaluation).

## 2023-03-07 NOTE — ED Triage Notes (Signed)
 Pt c/o nasal congestion with sore throat x2 weeks. Mucus is sometimes green. Also c/o HA and bilat ear pain/fullness. States he was treated for sinus infection in November with Augmentin  and steroids. Since finishing abx, symptoms have waxed and waned, but persisted over the last two weeks.

## 2023-03-27 ENCOUNTER — Encounter: Payer: Self-pay | Admitting: Emergency Medicine

## 2023-03-27 ENCOUNTER — Emergency Department
Admission: EM | Admit: 2023-03-27 | Discharge: 2023-03-27 | Disposition: A | Discharge status: Home / Self Care | Payer: Medicare HMO | Attending: Emergency Medicine | Admitting: Emergency Medicine

## 2023-03-27 ENCOUNTER — Emergency Department: Payer: Medicare HMO

## 2023-03-27 ENCOUNTER — Other Ambulatory Visit: Payer: Self-pay

## 2023-03-27 DIAGNOSIS — Z7901 Long term (current) use of anticoagulants: Secondary | ICD-10-CM | POA: Insufficient documentation

## 2023-03-27 DIAGNOSIS — Z1152 Encounter for screening for COVID-19: Secondary | ICD-10-CM | POA: Diagnosis not present

## 2023-03-27 DIAGNOSIS — R079 Chest pain, unspecified: Secondary | ICD-10-CM | POA: Insufficient documentation

## 2023-03-27 DIAGNOSIS — R9431 Abnormal electrocardiogram [ECG] [EKG]: Secondary | ICD-10-CM | POA: Diagnosis not present

## 2023-03-27 DIAGNOSIS — Z79899 Other long term (current) drug therapy: Secondary | ICD-10-CM | POA: Diagnosis present

## 2023-03-27 DIAGNOSIS — I4891 Unspecified atrial fibrillation: Secondary | ICD-10-CM | POA: Insufficient documentation

## 2023-03-27 DIAGNOSIS — I10 Essential (primary) hypertension: Secondary | ICD-10-CM | POA: Insufficient documentation

## 2023-03-27 DIAGNOSIS — E119 Type 2 diabetes mellitus without complications: Secondary | ICD-10-CM | POA: Diagnosis not present

## 2023-03-27 DIAGNOSIS — I4510 Unspecified right bundle-branch block: Secondary | ICD-10-CM | POA: Diagnosis not present

## 2023-03-27 DIAGNOSIS — R42 Dizziness and giddiness: Secondary | ICD-10-CM | POA: Diagnosis present

## 2023-03-27 DIAGNOSIS — R002 Palpitations: Secondary | ICD-10-CM | POA: Diagnosis present

## 2023-03-27 LAB — URINALYSIS, ROUTINE W REFLEX MICROSCOPIC
Bilirubin Urine: NEGATIVE
Glucose, UA: NEGATIVE mg/dL
Hgb urine dipstick: NEGATIVE
Ketones, ur: NEGATIVE mg/dL
Leukocytes,Ua: NEGATIVE
Nitrite: NEGATIVE
Protein, ur: NEGATIVE mg/dL
Specific Gravity, Urine: 1.005 (ref 1.005–1.030)
pH: 6 (ref 5.0–8.0)

## 2023-03-27 LAB — CBC
HCT: 44.4 % (ref 39.0–52.0)
Hemoglobin: 14.7 g/dL (ref 13.0–17.0)
MCH: 29.8 pg (ref 26.0–34.0)
MCHC: 33.1 g/dL (ref 30.0–36.0)
MCV: 90.1 fL (ref 80.0–100.0)
Platelets: 165 10*3/uL (ref 150–400)
RBC: 4.93 MIL/uL (ref 4.22–5.81)
RDW: 13 % (ref 11.5–15.5)
WBC: 8.4 10*3/uL (ref 4.0–10.5)
nRBC: 0 % (ref 0.0–0.2)

## 2023-03-27 LAB — BASIC METABOLIC PANEL
Anion gap: 13 (ref 5–15)
BUN: 20 mg/dL (ref 8–23)
CO2: 25 mmol/L (ref 22–32)
Calcium: 9.5 mg/dL (ref 8.9–10.3)
Chloride: 102 mmol/L (ref 98–111)
Creatinine, Ser: 1.01 mg/dL (ref 0.61–1.24)
GFR, Estimated: >60 mL/min (ref >60)
Glucose, Bld: 142 mg/dL — ABNORMAL HIGH (ref 70–99)
Potassium: 4.2 mmol/L (ref 3.5–5.1)
Sodium: 140 mmol/L (ref 135–145)

## 2023-03-27 LAB — MAGNESIUM: Magnesium: 1.9 mg/dL (ref 1.7–2.4)

## 2023-03-27 LAB — RESP PANEL BY RT-PCR (RSV, FLU A&B, COVID)  RVPGX2
Influenza A by PCR: NEGATIVE
Influenza B by PCR: NEGATIVE
Resp Syncytial Virus by PCR: NEGATIVE
SARS Coronavirus 2 by RT PCR: NEGATIVE

## 2023-03-27 LAB — TROPONIN I (HIGH SENSITIVITY): Troponin I (High Sensitivity): 4 ng/L (ref <18)

## 2023-03-27 MED ORDER — METOPROLOL TARTRATE 25 MG PO TABS
25.0000 mg | ORAL_TABLET | Freq: Once | ORAL | Status: AC
  Administered 2023-03-27: 25 mg via ORAL
  Filled 2023-03-27: qty 1

## 2023-03-27 MED ORDER — DILTIAZEM HCL ER COATED BEADS 180 MG PO CP24: 180.0000 mg | ORAL_CAPSULE | Freq: Every day | ORAL | 1 refills | Status: AC

## 2023-03-27 MED ORDER — METOPROLOL TARTRATE 5 MG/5ML IV SOLN
5.0000 mg | Freq: Once | INTRAVENOUS | Status: AC
  Administered 2023-03-27: 5 mg via INTRAVENOUS
  Filled 2023-03-27: qty 5

## 2023-03-27 NOTE — ED Provider Triage Note (Signed)
Emergency Medicine Provider Triage Evaluation Note  NICKI SCHNIDER , a 73 y.o. male  was evaluated in triage.  Pt complains of dizziness, chest pain.  Patient is taking Eliquis, metoprolol, Cardizem.  Review of Systems  Positive:  Negative:   Physical Exam  There were no vitals taken for this visit. Gen:   Awake, no distress, tachycardic Resp:  Normal effort no wheezing MSK:   Moves extremities without difficulty  Other:    Medical Decision Making  Medically screening exam initiated at 9:05 PM.  Appropriate orders placed.  Lissa Morales was informed that the remainder of the evaluation will be completed by another provider, this initial triage assessment does not replace that evaluation, and the importance of remaining in the ED until their evaluation is complete.  EKG showed A-fib patient will be transferred to pod Santiago Glad, PA-C 03/27/23 2110

## 2023-03-27 NOTE — ED Notes (Signed)
Second EKG completed at this time: sinus rhythm. Print out provided to Dr. Larinda Buttery.

## 2023-03-27 NOTE — ED Triage Notes (Signed)
Patient ambulatory to triage with complaints of palpitations and dizziness that started around 530 tonight. Patient's EKG shows A. Fib RVR rate in 120s, 130s. Patient has hx of eliquis, is on metoprolol and cardizem. States he has not missed doses.

## 2023-03-27 NOTE — ED Provider Notes (Signed)
[facility] Provider Note    Event Date/Time   First MD Initiated Contact with Patient 03/27/23 2131     (approximate)   History   Chief Complaint Palpitations and Dizziness   HPI  Ronnie Cunningham is a 73 y.o. male with past medical history of hypertension, diabetes, and atrial fibrillation on Eliquis who presents to the ED complaining of palpitations.  Patient reports that around 5:30 PM this evening he began to feel like his heart was beating irregularly and faster than usual.  This was associated with some dizziness and lightheadedness but he denies any pain in his chest or difficulty breathing.  He describes symptoms as similar to prior episodes of atrial fibrillation.  He states he has been compliant with his medications including Eliquis, metoprolol, and diltiazem.  He has recently been taking Levaquin for a sinus infection and endorses some sore throat but denies any fevers.     Physical Exam   Triage Vital Signs: ED Triage Vitals  Encounter Vitals Group     BP 03/27/23 2103 128/81     Systolic BP Percentile --      Diastolic BP Percentile --      Pulse Rate 03/27/23 2103 (!) 125     Resp 03/27/23 2103 18     Temp 03/27/23 2103 97.9 F (36.6 C)     Temp src --      SpO2 03/27/23 2103 98 %     Weight 03/27/23 2106 267 lb (121.1 kg)     Height 03/27/23 2106 5\' 11"  (1.803 m)     Head Circumference --      Peak Flow --      Pain Score 03/27/23 2106 4     Pain Loc --      Pain Education --      Exclude from Growth Chart --     Most recent vital signs: Vitals:   03/27/23 2103 03/27/23 2200  BP: 128/81 125/63  Pulse: (!) 125 85  Resp: 18 18  Temp: 97.9 F (36.6 C)   SpO2: 98% 96%    Constitutional: Alert and oriented. Eyes: Conjunctivae are normal. Head: Atraumatic. Nose: No congestion/rhinnorhea. Mouth/Throat: Mucous membranes are moist.  Cardiovascular: Tachycardic, irregularly irregular rhythm. Grossly normal heart sounds.  2+  radial pulses bilaterally. Respiratory: Normal respiratory effort.  No retractions. Lungs CTAB. Gastrointestinal: Soft and nontender. No distention. Musculoskeletal: No lower extremity tenderness nor edema.  Neurologic:  Normal speech and language. No gross focal neurologic deficits are appreciated.    ED Results / Procedures / Treatments   Labs (all labs ordered are listed, but only abnormal results are displayed) Labs Reviewed  BASIC METABOLIC PANEL - Abnormal; Notable for the following components:      Result Value   Glucose, Bld 142 (*)    All other components within normal limits  URINALYSIS, ROUTINE W REFLEX MICROSCOPIC - Abnormal; Notable for the following components:   Color, Urine STRAW (*)    APPearance CLEAR (*)    All other components within normal limits  RESP PANEL BY RT-PCR (RSV, FLU A&B, COVID)  RVPGX2  CBC  MAGNESIUM  TROPONIN I (HIGH SENSITIVITY)     EKG  ED ECG REPORT I, Chesley Noon, the attending physician, personally viewed and interpreted this ECG.   Date: 03/27/2023  EKG Time: 21:02  Rate: 124  Rhythm: atrial fibrillation  Axis: Normal  Intervals:right bundle branch block  ST&T Change: None  ED ECG REPORT I, Chesley Noon,  the attending physician, personally viewed and interpreted this ECG.   Date: 03/27/2023  EKG Time: 23:24  Rate: 87  Rhythm: normal sinus rhythm  Axis: Normal  Intervals:right bundle branch block  ST&T Change: None   RADIOLOGY Chest x-ray reviewed and interpreted by me with no infiltrate, edema, or effusion.  PROCEDURES:  Critical Care performed: No  Procedures   MEDICATIONS ORDERED IN ED: Medications  metoprolol tartrate (LOPRESSOR) tablet 25 mg (has no administration in time range)  metoprolol tartrate (LOPRESSOR) injection 5 mg (5 mg Intravenous Given 03/27/23 2226)  metoprolol tartrate (LOPRESSOR) injection 5 mg (5 mg Intravenous Given 03/27/23 2258)     IMPRESSION / MDM / ASSESSMENT AND PLAN / ED  COURSE  I reviewed the triage vital signs and the nursing notes.                              73 y.o. male with past medical history of hypertension, diabetes, and atrial fibrillation on Eliquis who presents to the ED complaining of palpitations with some lightheadedness starting earlier this evening.  Patient's presentation is most consistent with acute presentation with potential threat to life or bodily function.  Differential diagnosis includes, but is not limited to, arrhythmia, ACS, pneumonia, CHF, anemia, electrolyte abnormality, AKI.  Patient nontoxic-appearing and in no acute distress, vital signs remarkable for tachycardia but otherwise reassuring.  EKG shows atrial fibrillation with RVR, no ischemic changes noted and patient denies any symptoms concerning for ACS.  Lab results are pending at this time, chest x-ray is unremarkable.  Patient given 2 doses of IV metoprolol, has now converted to normal sinus rhythm with resolution of his symptoms.  Labs are reassuring with no significant anemia, leukocytosis, electrolyte abnormality, or AKI.  Troponin within normal limits and urinalysis shows no signs of infection.  Patient appropriate for discharge home with outpatient cardiology follow-up.  We will increase his dose of diltiazem and he was counseled to return to the ED for new or worsening symptoms, patient agrees with plan.      FINAL CLINICAL IMPRESSION(S) / ED DIAGNOSES   Final diagnoses:  Atrial fibrillation with RVR (HCC)     Rx / DC Orders   ED Discharge Orders          Ordered    diltiazem (CARDIZEM CD) 180 MG 24 hr capsule  Daily        03/27/23 2335             Note:  This document was prepared using Dragon voice recognition software and may include unintentional dictation errors.   Chesley Noon, MD 03/27/23 2337

## 2023-04-02 ENCOUNTER — Other Ambulatory Visit: Payer: Self-pay | Admitting: Internal Medicine

## 2023-04-02 DIAGNOSIS — J0191 Acute recurrent sinusitis, unspecified: Secondary | ICD-10-CM

## 2023-04-04 ENCOUNTER — Ambulatory Visit
Admission: RE | Admit: 2023-04-04 | Discharge: 2023-04-04 | Disposition: A | Discharge status: Home / Self Care | Payer: Medicare HMO | Source: Ambulatory Visit | Attending: Internal Medicine | Admitting: Internal Medicine

## 2023-04-04 DIAGNOSIS — J0191 Acute recurrent sinusitis, unspecified: Secondary | ICD-10-CM | POA: Insufficient documentation

## 2023-04-04 MED ORDER — IOHEXOL 300 MG/ML  SOLN
75.0000 mL | Freq: Once | INTRAMUSCULAR | Status: AC | PRN
  Administered 2023-04-04: 75 mL via INTRAVENOUS

## 2023-05-21 ENCOUNTER — Other Ambulatory Visit: Payer: Self-pay | Admitting: Urology

## 2023-05-21 DIAGNOSIS — N401 Enlarged prostate with lower urinary tract symptoms: Secondary | ICD-10-CM

## 2023-05-22 ENCOUNTER — Ambulatory Visit: Payer: Medicare HMO | Admitting: Urology

## 2023-05-22 VITALS — BP 83/45 | HR 75

## 2023-05-22 DIAGNOSIS — N529 Male erectile dysfunction, unspecified: Secondary | ICD-10-CM

## 2023-05-22 DIAGNOSIS — N401 Enlarged prostate with lower urinary tract symptoms: Secondary | ICD-10-CM

## 2023-05-22 DIAGNOSIS — N138 Other obstructive and reflux uropathy: Secondary | ICD-10-CM | POA: Diagnosis not present

## 2023-05-22 DIAGNOSIS — Z125 Encounter for screening for malignant neoplasm of prostate: Secondary | ICD-10-CM | POA: Diagnosis not present

## 2023-05-22 LAB — BLADDER SCAN AMB NON-IMAGING

## 2023-05-22 MED ORDER — TAMSULOSIN HCL 0.4 MG PO CAPS: 0.4000 mg | ORAL_CAPSULE | Freq: Every day | ORAL | 3 refills | Status: AC

## 2023-05-22 NOTE — Progress Notes (Signed)
   05/22/2023 1:04 PM   Ronnie Cunningham [birth year] [id number]  Reason for visit: Groin/hip/back pain, BPH, history of recurrent UTI, ED   HPI: 73 year old male with a long history of groin and lower back pain of unclear etiology, BPH, and recurrent UTIs.  No UTIs over the last year, PSAs have been normal, most recent 0.33 in January 2025, connected for finasteride 0.44.  He is on maximal medical therapy with Flomax and finasteride.  Prostate measures 30 g on prior CT.   I personally viewed and interpreted the most recent CT from October 2024 that was performed for abdominal pain which shows no stones, hydronephrosis, normal-appearing decompressed bladder, 30 g prostate.   Reassurance was provided that there is no urologic cause for his bilateral low back and hip pain.   Urinalysis in January was completely benign, normal renal function with EGFR greater than 60.  He also reports ED that has been present for greater than 5 years, has never tried medications for this.  We discussed options like Cialis or Viagra, he is not interested in adding medications at this time, but will contact us if he opts for trial of Viagra.  Flomax and finasteride refilled RTC 1 year  Sondra Come, MD  [facility] [address] [place], [place] [address] [phone]

## 2023-10-30 ENCOUNTER — Encounter: Payer: Self-pay | Admitting: Urology

## 2023-12-02 ENCOUNTER — Ambulatory Visit: Admitting: Podiatry

## 2023-12-02 ENCOUNTER — Encounter: Payer: Self-pay | Admitting: Podiatry

## 2023-12-02 VITALS — Ht 71.0 in | Wt 267.0 lb

## 2023-12-02 DIAGNOSIS — M79674 Pain in right toe(s): Secondary | ICD-10-CM | POA: Diagnosis not present

## 2023-12-02 DIAGNOSIS — M79675 Pain in left toe(s): Secondary | ICD-10-CM | POA: Diagnosis not present

## 2023-12-02 DIAGNOSIS — B351 Tinea unguium: Secondary | ICD-10-CM

## 2023-12-02 NOTE — Progress Notes (Signed)
   Chief Complaint  Patient presents with   Nail Problem    DFC.    SUBJECTIVE Patient presents to office today complaining of elongated, thickened nails that cause pain while ambulating in shoes.  Patient is unable to trim their own nails. Patient is here for further evaluation and treatment.  Past Medical History:  Diagnosis Date   Afib (HCC)    Asthma    BPH (benign prostatic hyperplasia)    Diabetes mellitus without complication (HCC)    GERD (gastroesophageal reflux disease)    Hypertension    Morbid obesity (HCC)    Seasonal allergies    Sleep apnea    Spasmodic dysphonia     Allergies  Allergen Reactions   Doxycycline Nausea Only    GI upset     Pollen Extract Other (See Comments)   Tape Other (See Comments)     OBJECTIVE General Patient is awake, alert, and oriented x 3 and in no acute distress. Derm Skin is dry and supple bilateral. Negative open lesions or macerations. Remaining integument unremarkable. Nails are tender, long, thickened and dystrophic with subungual debris, consistent with onychomycosis, 1-5 bilateral. No signs of infection noted. Vasc  DP and PT pedal pulses palpable bilaterally. Temperature gradient within normal limits.  Neuro Epicritic and protective threshold sensation grossly intact bilaterally.  Musculoskeletal Exam No symptomatic pedal deformities noted bilateral. Muscular strength within normal limits.  ASSESSMENT 1.  Pain due to onychomycosis of toenails both  PLAN OF CARE 1. Patient evaluated today.  2. Instructed to maintain good pedal hygiene and foot care.  3. Mechanical debridement of nails 1-5 bilaterally performed using a nail nipper. Filed with dremel without incident.  4. Return to clinic in 3 mos.    Thresa EMERSON Sar, DPM [facility]  Dr. Thresa EMERSON Sar, DPM    [address] [place], [place] [address]                Office [phone]  Fax [phone]

## 2023-12-29 ENCOUNTER — Other Ambulatory Visit: Payer: Self-pay | Admitting: Physical Medicine & Rehabilitation

## 2023-12-29 DIAGNOSIS — M542 Cervicalgia: Secondary | ICD-10-CM

## 2023-12-30 ENCOUNTER — Encounter: Payer: Self-pay | Admitting: Physical Medicine & Rehabilitation

## 2024-01-02 ENCOUNTER — Ambulatory Visit
Admission: RE | Admit: 2024-01-02 | Discharge: 2024-01-02 | Disposition: A | Discharge status: Home / Self Care | Source: Ambulatory Visit | Attending: Physical Medicine & Rehabilitation | Admitting: Physical Medicine & Rehabilitation

## 2024-01-02 DIAGNOSIS — M542 Cervicalgia: Secondary | ICD-10-CM

## 2024-02-23 ENCOUNTER — Other Ambulatory Visit: Payer: Self-pay | Admitting: Physician Assistant

## 2024-02-23 DIAGNOSIS — J329 Chronic sinusitis, unspecified: Secondary | ICD-10-CM

## 2024-02-24 ENCOUNTER — Other Ambulatory Visit

## 2024-02-24 ENCOUNTER — Ambulatory Visit
Admission: RE | Admit: 2024-02-24 | Discharge: 2024-02-24 | Disposition: A | Discharge status: Home / Self Care | Source: Ambulatory Visit | Attending: Physician Assistant | Admitting: Physician Assistant

## 2024-02-24 DIAGNOSIS — J329 Chronic sinusitis, unspecified: Secondary | ICD-10-CM | POA: Insufficient documentation

## 2024-02-24 MED ORDER — IOHEXOL 300 MG/ML  SOLN
100.0000 mL | Freq: Once | INTRAMUSCULAR | Status: AC | PRN
  Administered 2024-02-24: 100 mL via INTRAVENOUS

## 2024-03-17 ENCOUNTER — Other Ambulatory Visit (INDEPENDENT_AMBULATORY_CARE_PROVIDER_SITE_OTHER): Payer: Self-pay | Admitting: Nurse Practitioner

## 2024-03-17 DIAGNOSIS — I6501 Occlusion and stenosis of right vertebral artery: Secondary | ICD-10-CM

## 2024-03-19 ENCOUNTER — Encounter (INDEPENDENT_AMBULATORY_CARE_PROVIDER_SITE_OTHER): Payer: Self-pay | Admitting: Nurse Practitioner

## 2024-03-19 ENCOUNTER — Other Ambulatory Visit (INDEPENDENT_AMBULATORY_CARE_PROVIDER_SITE_OTHER)

## 2024-03-19 ENCOUNTER — Ambulatory Visit (INDEPENDENT_AMBULATORY_CARE_PROVIDER_SITE_OTHER): Admitting: Nurse Practitioner

## 2024-03-19 VITALS — BP 90/49 | HR 68 | Resp 17 | Ht 71.0 in | Wt 246.2 lb

## 2024-03-19 DIAGNOSIS — E119 Type 2 diabetes mellitus without complications: Secondary | ICD-10-CM

## 2024-03-19 DIAGNOSIS — I6523 Occlusion and stenosis of bilateral carotid arteries: Secondary | ICD-10-CM | POA: Diagnosis not present

## 2024-03-19 DIAGNOSIS — I6501 Occlusion and stenosis of right vertebral artery: Secondary | ICD-10-CM

## 2024-03-19 DIAGNOSIS — G450 Vertebro-basilar artery syndrome: Secondary | ICD-10-CM | POA: Diagnosis not present

## 2024-03-27 ENCOUNTER — Encounter (INDEPENDENT_AMBULATORY_CARE_PROVIDER_SITE_OTHER): Payer: Self-pay | Admitting: Nurse Practitioner
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
## 2024-05-26 ENCOUNTER — Ambulatory Visit: Admitting: Urology

## 2024-05-27 ENCOUNTER — Ambulatory Visit: Admitting: Urology

## 2024-09-16 ENCOUNTER — Encounter (INDEPENDENT_AMBULATORY_CARE_PROVIDER_SITE_OTHER)

## 2024-09-16 ENCOUNTER — Ambulatory Visit (INDEPENDENT_AMBULATORY_CARE_PROVIDER_SITE_OTHER): Admitting: Nurse Practitioner
